# Patient Record
Sex: Female | Born: 1985 | Hispanic: Yes | Marital: Married | Smoking: Never smoker
Health system: Southern US, Community
[De-identification: ages and names within clinical notes are randomized; demographics above are authoritative.]

## PROBLEM LIST (undated history)

## (undated) DIAGNOSIS — R51 Headache: Secondary | ICD-10-CM

## (undated) DIAGNOSIS — R519 Headache, unspecified: Secondary | ICD-10-CM

## (undated) DIAGNOSIS — B690 Cysticercosis of central nervous system: Secondary | ICD-10-CM

## (undated) DIAGNOSIS — O0930 Supervision of pregnancy with insufficient antenatal care, unspecified trimester: Secondary | ICD-10-CM

## (undated) HISTORY — DX: Cysticercosis of central nervous system: B69.0

## (undated) HISTORY — DX: Supervision of pregnancy with insufficient antenatal care, unspecified trimester: O09.30

## (undated) HISTORY — PX: ENDOSCOPIC SURGERY OF THIRD VENTRICLE: SUR445

---

## 2011-08-12 NOTE — L&D Delivery Note (Signed)
Delivery Note At 9:43 AM a viable female was delivered via Vaginal, Spontaneous Delivery (Presentation:ROA).  APGAR: 8, 9; weight .   Placenta status: Intact, Spontaneous.  Cord: 3 vessels with the following complications: .  Cord pH: n/a  Anesthesia: None  Episiotomy: n/a Lacerations: small 1st on r posterior wall, not bleeding no repair; small periurethral tear, not bleeding, no repair Suture Repair: n/a Est. Blood Loss (mL):  Mom to AICU for continued magnesium X24o; BP 150s/90s. Baby to nursery-stable; NICU present at delivery; no resuscitation necessary  Philipp Deputy CNM  Andrena Mews, DO [facility] Resident - PGY-1 12/30/2011 10:00 AM

## 2011-09-01 ENCOUNTER — Other Ambulatory Visit (HOSPITAL_COMMUNITY)
Admission: RE | Admit: 2011-09-01 | Discharge: 2011-09-01 | Disposition: A | Discharge status: Home / Self Care | Payer: Medicaid Other | Source: Ambulatory Visit | Attending: Obstetrics and Gynecology | Admitting: Obstetrics and Gynecology

## 2011-09-01 DIAGNOSIS — Z01419 Encounter for gynecological examination (general) (routine) without abnormal findings: Secondary | ICD-10-CM | POA: Insufficient documentation

## 2011-09-01 DIAGNOSIS — Z113 Encounter for screening for infections with a predominantly sexual mode of transmission: Secondary | ICD-10-CM | POA: Insufficient documentation

## 2011-09-01 LAB — OB RESULTS CONSOLE RPR: RPR: NONREACTIVE

## 2011-09-01 LAB — OB RESULTS CONSOLE ABO/RH: RH Type: POSITIVE

## 2011-11-27 LAB — OB RESULTS CONSOLE GBS: GBS: NEGATIVE

## 2011-12-03 LAB — OB RESULTS CONSOLE GC/CHLAMYDIA: Chlamydia: NEGATIVE

## 2011-12-25 ENCOUNTER — Encounter (HOSPITAL_COMMUNITY): Payer: Self-pay

## 2011-12-25 ENCOUNTER — Inpatient Hospital Stay (HOSPITAL_COMMUNITY)
Admission: AD | Admit: 2011-12-25 | Discharge: 2011-12-25 | Disposition: A | Discharge status: Home / Self Care | Payer: Self-pay | Source: Ambulatory Visit | Attending: Obstetrics and Gynecology | Admitting: Obstetrics and Gynecology

## 2011-12-25 DIAGNOSIS — O209 Hemorrhage in early pregnancy, unspecified: Secondary | ICD-10-CM | POA: Insufficient documentation

## 2011-12-25 DIAGNOSIS — O4700 False labor before 37 completed weeks of gestation, unspecified trimester: Secondary | ICD-10-CM | POA: Insufficient documentation

## 2011-12-25 DIAGNOSIS — Z3493 Encounter for supervision of normal pregnancy, unspecified, third trimester: Secondary | ICD-10-CM

## 2011-12-25 LAB — WET PREP, GENITAL

## 2011-12-25 NOTE — Discharge Instructions (Signed)
Mtodo para contar los movimientos fetales (Fetal Movement Counts) Nombre de la paciente: __________________________________________________ Wendy Delgado probable de parto:____________________ En los embarazos de alto riesgo se recomienda contar las pataditas, pero tambin es una buena idea que lo hagan todas las Wendy Delgado. Comience a contarlas a las 28 semanas de embarazo. Los movimientos fetales aumentan luego de una comida Immunologist o de comer o beber algo dulce (el nivel de azcar en la sangre est ms alto). Tambin es importante beber gran cantidad de lquidos (hidratarse bien) antes de contar. Si se recuesta sobre el lado izquierdo mejorar la Wendy Delgado, o puede sentarse en una silla cmoda con los brazos sobre el abdomen y sin distracciones que la rodeen. CONTANDO  Trate de contar a la Wendy Delgado lo haga.   Marque el da y la hora y vea cunto le lleva sentir 10 movimientos (patadas, agitaciones, sacudones, vueltas). Debe sentir al menos 10 movimientos en 2 horas. Probablemente sienta los 10 movimientos en menos de dos horas. Si no los siente, espere una hora y cuente nuevamente. Luego de Wendy Delgado tendr un patrn.   Debemos observar si hay cambios en el patrn o no hay suficientes pataditas en 2 horas. Le lleva ms tiempo contar los 10 movimientos?  SOLICITE ATENCIN MDICA SI:  Siente menos de 10 pataditas en 2 horas. Wendy Delgado dos veces.   No siente movimientos durante 1 hora.   El patrn se modifica o le lleva ms tiempo Wendy Delgado las 10 pataditas.   Siente que el beb no se mueve como lo hace habitualmente.  Fecha: ____________ Movimientos: ____________ Comienzo hora: ____________ Wendy Delgado: ____________ Wendy Delgado: ____________ Movimientos: ____________ Comienzo hora: ____________ Wendy Delgado: ____________ Wendy Delgado: ____________ Movimientos: ____________ Comienzo hora: ____________ Wendy Delgado: ____________ Wendy Delgado: ____________ Movimientos: ____________ Comienzo hora:  ____________ Wendy Delgado: ____________ Wendy Delgado: ____________ Movimientos: ____________ Comienzo hora: ____________ Wendy Delgado: ____________ Wendy Delgado: ____________ Movimientos: ____________ Comienzo hora: ____________ Wendy Delgado: ____________ Wendy Delgado: ____________ Movimientos: ____________ Comienzo hora: ____________ Wendy Delgado: ____________  Wendy Delgado: ____________ Movimientos: ____________ Comienzo hora: ____________ Wendy Delgado: ____________ Wendy Delgado: ____________ Movimientos: ____________ Comienzo hora: ____________ Wendy Delgado: ____________ Wendy Delgado: ____________ Movimientos: ____________ Comienzo hora: ____________ Wendy Delgado: ____________ Wendy Delgado: ____________ Movimientos: ____________ Comienzo hora: ____________ Wendy Delgado: ____________ Wendy Delgado: ____________ Movimientos: ____________ Comienzo hora: ____________ Wendy Delgado: ____________ Wendy Delgado: ____________ Movimientos: ____________ Comienzo hora: ____________ Wendy Delgado: ____________ Wendy Delgado: ____________ Movimientos: ____________ Comienzo hora: ____________ Wendy Delgado: ____________  Wendy Delgado: ____________ Movimientos: ____________ Comienzo hora: ____________ Wendy Delgado: ____________ Wendy Delgado: ____________ Movimientos: ____________ Comienzo hora: ____________ Wendy Delgado: ____________ Wendy Delgado: ____________ Movimientos: ____________ Comienzo hora: ____________ Wendy Delgado: ____________ Wendy Delgado: ____________ Movimientos: ____________ Comienzo hora: ____________ Wendy Delgado: ____________ Wendy Delgado: ____________ Movimientos: ____________ Comienzo hora: ____________ Wendy Delgado: ____________ Wendy Delgado: ____________ Movimientos: ____________ Comienzo hora: ____________ Wendy Delgado: ____________ Wendy Delgado: ____________ Movimientos: ____________ Comienzo hora: ____________ Wendy Delgado: ____________  Wendy Delgado: ____________ Movimientos: ____________ Comienzo hora: ____________ Wendy Delgado: ____________ Wendy Delgado: ____________ Movimientos: ____________ Comienzo hora: ____________ Wendy Delgado: ____________ Wendy Delgado: ____________ Movimientos: ____________  Comienzo hora: ____________ Wendy Delgado: ____________ Wendy Delgado: ____________ Movimientos: ____________ Comienzo hora: ____________ Wendy Delgado: ____________ Wendy Delgado: ____________ Movimientos: ____________ Comienzo hora: ____________ Wendy Delgado: ____________ Wendy Delgado: ____________ Movimientos: ____________ Comienzo hora: ____________ Wendy Delgado: ____________ Wendy Delgado: ____________ Movimientos: ____________ Comienzo hora: ____________ Wendy Delgado: ____________  Wendy Delgado: ____________ Movimientos: ____________ Comienzo hora: ____________ Wendy Delgado: ____________ Wendy Delgado: ____________ Movimientos: ____________ Comienzo hora: ____________ Wendy Delgado: ____________ Wendy Delgado: ____________ Movimientos: ____________ Comienzo hora: ____________ Wendy Delgado: ____________ Wendy Delgado: ____________ Movimientos: ____________ Comienzo hora: ____________ Wendy Delgado: ____________ Wendy Delgado: ____________ Movimientos:  ____________ Comienzo hora: ____________ Wendy Delgado: ____________ Wendy Delgado: ____________ Movimientos: ____________ Comienzo hora: ____________ Wendy Delgado: ____________ Wendy Delgado: ____________ Movimientos: ____________ Comienzo hora: ____________ Wendy Delgado: ____________  Wendy Delgado: ____________ Movimientos: ____________ Comienzo hora: ____________ Wendy Delgado: ____________ Wendy Delgado: ____________ Movimientos: ____________ Comienzo hora: ____________ Wendy Delgado: ____________ Wendy Delgado: ____________ Movimientos: ____________ Comienzo hora: ____________ Wendy Delgado: ____________ Wendy Delgado: ____________ Movimientos: ____________ Comienzo hora: ____________ Wendy Delgado: ____________ Wendy Delgado: ____________ Movimientos: ____________ Comienzo hora: ____________ Wendy Delgado: ____________ Wendy Delgado: ____________ Movimientos: ____________ Comienzo hora: ____________ Wendy Delgado: ____________ Wendy Delgado: ____________ Movimientos: ____________ Comienzo hora: ____________ Wendy Delgado: ____________  Wendy Delgado: ____________ Movimientos: ____________ Comienzo hora: ____________ Wendy Delgado: ____________ Wendy Delgado: ____________ Movimientos:  ____________ Comienzo hora: ____________ Wendy Delgado: ____________ Wendy Delgado: ____________ Movimientos: ____________ Comienzo hora: ____________ Wendy Delgado: ____________ Wendy Delgado: ____________ Movimientos: ____________ Comienzo hora: ____________ Wendy Delgado: ____________ Wendy Delgado: ____________ Movimientos: ____________ Comienzo hora: ____________ Wendy Delgado: ____________ Wendy Delgado: ____________ Movimientos: ____________ Comienzo hora: ____________ Wendy Delgado: ____________ Wendy Delgado: ____________ Movimientos: ____________ Comienzo hora: ____________ Wendy Delgado: ____________  Wendy Delgado: ____________ Movimientos: ____________ Comienzo hora: ____________ Wendy Delgado: ____________ Wendy Delgado: ____________ Movimientos: ____________ Comienzo hora: ____________ Wendy Delgado: ____________ Wendy Delgado: ____________ Movimientos: ____________ Comienzo hora: ____________ Wendy Delgado: ____________ Wendy Delgado: ____________ Movimientos: ____________ Comienzo hora: ____________ Wendy Delgado: ____________ Wendy Delgado: ____________ Movimientos: ____________ Comienzo hora: ____________ Wendy Delgado: ____________ Wendy Delgado: ____________ Movimientos: ____________ Comienzo hora: ____________ Wendy Delgado: ____________ Wendy Delgado: ____________ Movimientos: ____________ Comienzo hora: ____________ Fin hora: ____________  Document Released: 11/04/2007 Document Revised: 07/17/2011 ExitCare Patient Information 2012 [place], [place].Contracciones de Designer, multimedia ([employer]) Usted presenta un falso trabajo de [place]. Durante todo el embarazo aparecen con frecuencia contracciones del tero. Hacia el final del embarazo (32-34 semanas) estas contracciones (Braxton Hicks) pueden hacerse ms fuertes. No se trata de un trabajo de parto verdadero porque no producen un agrandamiento (dilatacin) y afinamiento del cuello del tero. Algunas veces resulta difcil distinguirlas del trabajo de parto verdadero porque en algunos casos llegan a ser muy intensas y las personas tienen distinta tolerancia al [employer] No  debe sentirse avergonzada si ingresa al hospital con un falso trabajo de [place]. En ocasiones la nica forma de saber si est en un parto verdadero es observar los cambios en el cuello del tero. A veces, la nica forma de saber si realmente est en trabajo de parto es para el mdico observar los cambios en el tero. Como diferenciar el [place] de parto falso del verdadero:  Aggie Cosier de parto falso.   Las contracciones falsas generalmente duran menos y no son tan intensas como las verdaderas.   Generalmente se sienten en la zona inferior del abdomen y en la ingle.   Pueden aliviarse con una caminata o cambiar de posicin mientras se est acostada.   A medida que pasa el tiempo son ms cortas y dbiles.   Generalmente son irregulares.   No se hacen progresivamente ms intensas y Herbalist entre s Lear Delgado.   Trabajo de parto verdadero.   Las contracciones verdaderas duran de 30 a 70 segundos, son ms regulares, generalmente se hacen ms intensas y Comptroller.   No desaparecen al caminar.   La molestia generalmente se siente en la parte superior del tero y se extiende hacia la zona inferior del abdomen y [employer] cintura.   El profesional que la asiste podr examinarla para determinar si el trabajo de parto es verdadero. El examen mostrar si el cuello del tero se est dilatando y [place].  Si no hay problemas prenatales u otras complicaciones de [employer] al  embarazo, no habr inconvenientes si la envan a su casa y espera el comienzo del verdadero trabajo de [place]. INSTRUCCIONES PARA EL CUIDADO DOMICILIARIO  Siga con los ejercicios y las indicaciones habituales.   Tome los medicamentos como se le indic.   Cumpla con las citas regularmente.   Coma y beba ligero si cree que dar a luz.   Si se siente incmoda por las contracciones:   Cambie de [place], si est acostada o en reposo, camine y si est caminando, repose.   Sintense y repose en  una baadera con agua caliente.   Beba entre 2 y 3 vasos de [place]. La deshidratacin puede causar contracciones BH.   Respire lenta y profundamente varias veces por hora.  SOLICITE ATENCIN MDICA DE INMEDIATO SI:  Las contracciones se intensifican, se hacen ms regulares y [employer] s.   Tiene una prdida importante de lquido de la vagina   La temperatura oral se eleva sin motivo por encima de 102 F (38.9 C) o segn le indique el profesional que la asiste.   Elimina una mucosidad sanguinolenta.   Presenta hemorragia vaginal.   Presenta dolor abdominal constante.   Siente un dolor en la parte baja de la espalda que nunca haba sentido antes.   Siente que el beb empuja hacia abajo y le causa presin plvica.   El beb no se mueve tanto como antes.  Document Released: 05/07/2005 Document Revised: 07/17/2011 [facility] Patient Information 2012 [place], [place].

## 2011-12-25 NOTE — Progress Notes (Signed)
[facility] Department of Clinical Social Work Documentation of Interpretation   I assisted __Sherryl RN_________________ with interpretation of ____questions__________________ for this patient.

## 2011-12-25 NOTE — MAU Provider Note (Signed)
  History     CSN: [id number]  Arrival date and time: 12/25/11 1319   None     Chief Complaint  Patient presents with  . Vaginal Bleeding  . Abdominal Pain  . Labor Eval   HPI  Pt is here with report of blood tinged mucus while wiping today.  Also, reports contractions that started today.  Intercourse earlier today.  Increase urge to urinate when having contractions.  Denies UTI symptoms.  +fetal movement.  History reviewed. No pertinent past medical history.  History reviewed. No pertinent past surgical history.  History reviewed. No pertinent family history.  History  Substance Use Topics  . Smoking status: Not on file  . Smokeless tobacco: Not on file  . Alcohol Use:     Allergies: No Known Allergies  Prescriptions prior to admission  Medication Sig Dispense Refill  . Prenatal Vit-Fe Fumarate-FA (PRENATAL MULTIVITAMIN) TABS Take 1 tablet by mouth daily.        Review of Systems  Gastrointestinal: Positive for abdominal pain (contractions).  Genitourinary:       Blood tinged mucus  All other systems reviewed and are negative.   Physical Exam   Blood pressure 135/87, pulse 86, temperature 97.7 F (36.5 C), temperature source Oral, height 5' 0.5" (1.537 m), weight 104.599 kg (230 lb 9.6 oz).  Physical Exam  Constitutional: She is oriented to person, place, and time. She appears well-developed and well-nourished. No distress.       Laughing with FOB  HENT:  Head: Normocephalic.  Neck: Normal range of motion. Neck supple.  Cardiovascular: Normal rate, regular rhythm and normal heart sounds.   Respiratory: Effort normal and breath sounds normal.  GI: Soft. There is no tenderness.  Genitourinary: No bleeding around the vagina. Vaginal discharge (mucusy) found.       1-2/50/-3 Ferns negative  Musculoskeletal: Normal range of motion. She exhibits edema ( +3 pedal swelling).  Neurological: She is alert and oriented to person, place, and time.  Skin: Skin is  warm and dry.    MAU Course  Procedures Results for orders placed during the hospital encounter of 12/25/11 (from the past 24 hour(s))  WET PREP, GENITAL     Status: Abnormal   Collection Time   12/25/11  3:43 PM      Component Value Range   Yeast Wet Prep HPF POC NONE SEEN  NONE SEEN    Trich, Wet Prep NONE SEEN  NONE SEEN    Clue Cells Wet Prep HPF POC NONE SEEN  NONE SEEN    WBC, Wet Prep HPF POC MANY (*) NONE SEEN   pt DC home  Fern-neg per Sid Falcon, CNM  Assessment and Plan  Bleeding During Intercourse Braxton Hicks  Plan: DC to home Labor Precautions   [facility] 12/25/2011, 3:55 PM

## 2011-12-25 NOTE — Progress Notes (Signed)
[facility] Department of Clinical Social Work Documentation of Interpretation   I assisted __Helen RN_________________ with interpretation of ___questions___________________ for this patient.

## 2011-12-25 NOTE — MAU Note (Signed)
Patient passed her mucus plug this morning has had contractions G1 [facility] 5/12

## 2011-12-25 NOTE — Progress Notes (Signed)
Pt given discharge instructions with interpreter at bedside. Pt voiced understanding and discharge instructions printed int spanish.

## 2011-12-26 ENCOUNTER — Telehealth (HOSPITAL_COMMUNITY): Payer: Self-pay | Admitting: *Deleted

## 2011-12-26 ENCOUNTER — Encounter (HOSPITAL_COMMUNITY): Payer: Self-pay | Admitting: *Deleted

## 2011-12-26 NOTE — Telephone Encounter (Signed)
Preadmission screen Interpreter number [phone]

## 2011-12-26 NOTE — MAU Provider Note (Signed)
Agree with above note.  Wendy Delgado 12/26/2011 9:04 AM   

## 2011-12-29 ENCOUNTER — Inpatient Hospital Stay (HOSPITAL_COMMUNITY)
Admission: AD | Admit: 2011-12-29 | Discharge: 2012-01-01 | DRG: 774 | Disposition: A | Discharge status: Home / Self Care | Payer: Medicaid Other | Source: Ambulatory Visit | Attending: Obstetrics & Gynecology | Admitting: Obstetrics & Gynecology

## 2011-12-29 ENCOUNTER — Encounter (HOSPITAL_COMMUNITY): Payer: Self-pay | Admitting: *Deleted

## 2011-12-29 DIAGNOSIS — O1414 Severe pre-eclampsia complicating childbirth: Principal | ICD-10-CM | POA: Diagnosis present

## 2011-12-29 LAB — COMPREHENSIVE METABOLIC PANEL
Albumin: 2.7 g/dL — ABNORMAL LOW (ref 3.5–5.2)
Alkaline Phosphatase: 214 U/L — ABNORMAL HIGH (ref 39–117)
BUN: 6 mg/dL (ref 6–23)
CO2: 19 mEq/L (ref 19–32)
Chloride: 101 mEq/L (ref 96–112)
Creatinine, Ser: 0.33 mg/dL — ABNORMAL LOW (ref 0.50–1.10)
GFR calc Af Amer: >90 mL/min (ref >90)
GFR calc non Af Amer: >90 mL/min (ref >90)
Glucose, Bld: 100 mg/dL — ABNORMAL HIGH (ref 70–99)
Total Bilirubin: 0.3 mg/dL (ref 0.3–1.2)

## 2011-12-29 LAB — CBC
HCT: 40.1 % (ref 36.0–46.0)
MCH: 31.4 pg (ref 26.0–34.0)
MCV: 93.9 fL (ref 78.0–100.0)
Platelets: 273 10*3/uL (ref 150–400)
RBC: 4.27 MIL/uL (ref 3.87–5.11)

## 2011-12-29 MED ORDER — MAGNESIUM SULFATE 40 G IN LACTATED RINGERS - SIMPLE
2.0000 g/h | INTRAVENOUS | Status: DC
  Filled 2011-12-29: qty 500

## 2011-12-29 MED ORDER — ACETAMINOPHEN 325 MG PO TABS: 650.0000 mg | ORAL_TABLET | ORAL | Status: DC | PRN

## 2011-12-29 MED ORDER — ONDANSETRON HCL 4 MG/2ML IJ SOLN: 4.0000 mg | Freq: Four times a day (QID) | INTRAMUSCULAR | Status: DC | PRN

## 2011-12-29 MED ORDER — OXYCODONE-ACETAMINOPHEN 5-325 MG PO TABS: 1.0000 | ORAL_TABLET | ORAL | Status: DC | PRN

## 2011-12-29 MED ORDER — LACTATED RINGERS IV SOLN: 500.0000 mL | INTRAVENOUS | Status: DC | PRN

## 2011-12-29 MED ORDER — FLEET ENEMA 7-19 GM/118ML RE ENEM: 1.0000 | ENEMA | RECTAL | Status: DC | PRN

## 2011-12-29 MED ORDER — LACTATED RINGERS IV SOLN
INTRAVENOUS | Status: DC
  Administered 2011-12-29: 23:00:00 via INTRAVENOUS
  Administered 2011-12-30: 50 mL/h via INTRAVENOUS

## 2011-12-29 MED ORDER — OXYTOCIN BOLUS FROM INFUSION
500.0000 mL | Freq: Once | INTRAVENOUS | Status: DC
  Filled 2011-12-29: qty 1000
  Filled 2011-12-29: qty 500

## 2011-12-29 MED ORDER — IBUPROFEN 600 MG PO TABS: 600.0000 mg | ORAL_TABLET | Freq: Four times a day (QID) | ORAL | Status: DC | PRN

## 2011-12-29 MED ORDER — CITRIC ACID-SODIUM CITRATE 334-500 MG/5ML PO SOLN: 30.0000 mL | ORAL | Status: DC | PRN

## 2011-12-29 MED ORDER — LABETALOL HCL 5 MG/ML IV SOLN
20.0000 mg | Freq: Once | INTRAVENOUS | Status: AC
  Administered 2011-12-30: 20 mg via INTRAVENOUS
  Filled 2011-12-29: qty 4

## 2011-12-29 MED ORDER — OXYTOCIN 20 UNITS IN LACTATED RINGERS INFUSION - SIMPLE
125.0000 mL/h | Freq: Once | INTRAVENOUS | Status: AC
  Administered 2011-12-30: 125 mL/h via INTRAVENOUS

## 2011-12-29 MED ORDER — MAGNESIUM SULFATE BOLUS VIA INFUSION
4.0000 g | Freq: Once | INTRAVENOUS | Status: AC
  Administered 2011-12-29: 4 g via INTRAVENOUS
  Filled 2011-12-29: qty 500

## 2011-12-29 MED ORDER — BUTORPHANOL TARTRATE 2 MG/ML IJ SOLN
1.0000 mg | INTRAMUSCULAR | Status: DC | PRN
  Administered 2011-12-30 (×2): 1 mg via INTRAVENOUS
  Filled 2011-12-29 (×2): qty 1

## 2011-12-29 MED ORDER — LIDOCAINE HCL (PF) 1 % IJ SOLN
30.0000 mL | INTRAMUSCULAR | Status: DC | PRN
  Filled 2011-12-29: qty 30

## 2011-12-29 NOTE — H&P (Signed)
Wendy Delgado is a 26 y.o. G1P0 @ 41'1 by uncertain LMP consistent with a 24wk [place] who presented with a chief complaint of LOF, clear/pink, at 21:20.  Ctx started since arrival at hospital.  Good FM.  No frank bleeding.  No HA, vision changes, SOB, CP, RUQ pain.  Has had hand and feet swelling all of pregnancy, stable.  Husband reports face swelling for 1-2 weeks.  Pt denies any elevated BPs in the office.  Last OV was last week.  Cervix 1-2/50%/-3 on 5/16.  Prenatal issues: -Care at [facility] -Late to care (presented after 20 wks). -Dating by uncertain LMP and 24wk [place], which both gave EDD 12/21/11 according to prenatal records.  -GBS neg, O+, rubella immune, HSV neg, HIV neg, Hgb normal, RPR NR. -No elevated BPs noted in prenatal record (last note is from 12/11/11).  Maternal Medical History:  Reason for admission: Reason for Admission:   nauseaPrenatal complications: Hypertension.     OB History    Grav Para Term Preterm Abortions TAB SAB Ect Mult Living   1 0 0 0 0 0 0 0 0 0      Past Medical History  Diagnosis Date  . Late prenatal care    History reviewed. No pertinent past surgical history. Family History: family history includes Diabetes in an unspecified family member. Social History:  reports that she has never smoked. She has never used smokeless tobacco. She reports that she does not drink alcohol or use illicit drugs.  Review of Systems  Constitutional: Negative for fever and chills.  Eyes: Negative for blurred vision and double vision.  Respiratory: Negative for shortness of breath and wheezing.   Cardiovascular: Negative for chest pain and palpitations.  Gastrointestinal: Negative for nausea, vomiting and diarrhea.  Genitourinary: Negative for dysuria and urgency.  Musculoskeletal: Negative for myalgias and joint pain.  Skin: Negative for itching and rash.  Neurological: Negative for focal weakness, seizures and headaches.  Endo/Heme/Allergies: Negative for  polydipsia. Does not bruise/bleed easily.  Psychiatric/Behavioral: Negative for depression. The patient is not nervous/anxious.    Blood pressure trend: 128/92 173/96 164/94 171/104 171/82 Pulse 76, temperature 98.3 F (36.8 C), temperature source Oral, resp. rate 18, height 5' 0.5" (1.537 m), weight 102.967 kg (227 lb), SpO2 100.00%. Exam Physical Exam  Constitutional: She is oriented to person, place, and time. No distress.  HENT:  Head: Normocephalic and atraumatic.  Eyes: Conjunctivae and EOM are normal.  Neck: Normal range of motion. Neck supple.  Cardiovascular: Normal rate, regular rhythm, normal heart sounds and intact distal pulses.   Respiratory: Effort normal and breath sounds normal.  GI:       Gravid, EFW 7.5lb, no RUQ tenderness.  Genitourinary: Vagina normal.  Musculoskeletal: Normal range of motion.       1+ pitting edema to mid-pre-tibial areas bilaterally.  No pitting edema hands or face.  Neurological: She is alert and oriented to person, place, and time.       2+ DTRs biceps bilaterally.  Skin: Skin is warm and dry. She is not diaphoretic.  Psychiatric: She has a normal mood and affect. Her behavior is normal.   EFM: baseline 145, moderate variability, +accels, intermittent early decels. Toco: q35min SVE: 2.5/90/0/vtx including sutures palpated/mid-position/soft/grossly ruptured, light mec, +fern   Prenatal labs: ABO, Rh: O/Positive/-- (01/21 0000) Antibody: Negative (01/21 0000) Rubella: Immune (01/21 0000) RPR: Nonreactive (01/21 0000)  HBsAg: Negative (01/21 0000)  HIV: Non-reactive (01/21 0000)  GBS: Negative (04/18 0000)   Assessment/Plan: Wendy Delgado  Threat is a 26 y.o. G1P0 @ 41'1 by uncertain LMP consistent with a 24wk [place] who is being admitted with SROM, latent labor, and severe-range BPs with concern for severe preeclampsia.  -Severe-range BPs with concern for severe preeclampsia:  No elevated BPs in office.  Possible face swelling x 2 wks but no  other signs or symptoms of preeclampsia (apparently LE edema is at baseline).  CMP, CBC and urine P:C via straight cath are pending.  Will give labetalol 20mg  IV x 1 dose now for severe-range BP and start Magnesium (4g bolus then 2g per hour) for seizure ppx.  Will augment labor if needed to expedite delivery.      -SROM/labor: SROM, clear, at 21:20, now with light mec.  Latent labor, but increasingly painful and regular ctx.  Will recheck cervix 2hrs from last check and plan pitocin if inadequate change.  Anticipate SVD.  -Pain control: wants to avoid epidural if possible but is interested in IV medications.   -FWB: cat 1 tracing, vtx, GBS neg, light meconium, will have additional neonatal providers at delivery.  Chancy Hurter MD 12/29/2011, 11:21 PM   Saw pt and agree with above documentation. 2 hr glucola: [phone]. No clonus.   Meconium stained amniotic fluid. D/W Dr. Macon Large re: plan to proceed with MgSO4 and IV labetalol.   Koi Zangara 12/30/2011 12:03 AM

## 2011-12-30 ENCOUNTER — Inpatient Hospital Stay (HOSPITAL_COMMUNITY): Admission: RE | Admit: 2011-12-30 | Payer: Self-pay | Source: Ambulatory Visit | Admitting: Obstetrics & Gynecology

## 2011-12-30 ENCOUNTER — Encounter (HOSPITAL_COMMUNITY): Payer: Self-pay | Admitting: *Deleted

## 2011-12-30 DIAGNOSIS — O1414 Severe pre-eclampsia complicating childbirth: Secondary | ICD-10-CM

## 2011-12-30 LAB — COMPREHENSIVE METABOLIC PANEL
Albumin: 2.7 g/dL — ABNORMAL LOW (ref 3.5–5.2)
BUN: 6 mg/dL (ref 6–23)
Calcium: 8.4 mg/dL (ref 8.4–10.5)
Creatinine, Ser: 0.42 mg/dL — ABNORMAL LOW (ref 0.50–1.10)
GFR calc Af Amer: >90 mL/min (ref >90)
Glucose, Bld: 154 mg/dL — ABNORMAL HIGH (ref 70–99)
Total Protein: 6.3 g/dL (ref 6.0–8.3)

## 2011-12-30 LAB — PROTEIN / CREATININE RATIO, URINE
Creatinine, Urine: 66.38 mg/dL
Total Protein, Urine: 17.2 mg/dL

## 2011-12-30 MED ORDER — SIMETHICONE 80 MG PO CHEW: 80.0000 mg | CHEWABLE_TABLET | ORAL | Status: DC | PRN

## 2011-12-30 MED ORDER — MAGNESIUM SULFATE 40 G IN LACTATED RINGERS - SIMPLE
2.0000 g/h | INTRAVENOUS | Status: DC
  Administered 2011-12-30: 2 g/h via INTRAVENOUS
  Filled 2011-12-30 (×2): qty 500

## 2011-12-30 MED ORDER — HYDRALAZINE HCL 20 MG/ML IJ SOLN
INTRAMUSCULAR | Status: AC
  Filled 2011-12-30: qty 1

## 2011-12-30 MED ORDER — DIPHENHYDRAMINE HCL 25 MG PO CAPS: 25.0000 mg | ORAL_CAPSULE | Freq: Four times a day (QID) | ORAL | Status: DC | PRN

## 2011-12-30 MED ORDER — PRENATAL MULTIVITAMIN CH
1.0000 | ORAL_TABLET | Freq: Every day | ORAL | Status: DC
  Administered 2011-12-30 – 2012-01-01 (×3): 1 via ORAL
  Filled 2011-12-30 (×3): qty 1

## 2011-12-30 MED ORDER — LANOLIN HYDROUS EX OINT: TOPICAL_OINTMENT | CUTANEOUS | Status: DC | PRN

## 2011-12-30 MED ORDER — OXYCODONE-ACETAMINOPHEN 5-325 MG PO TABS: 1.0000 | ORAL_TABLET | ORAL | Status: DC | PRN

## 2011-12-30 MED ORDER — ZOLPIDEM TARTRATE 5 MG PO TABS: 5.0000 mg | ORAL_TABLET | Freq: Every evening | ORAL | Status: DC | PRN

## 2011-12-30 MED ORDER — TETANUS-DIPHTH-ACELL PERTUSSIS 5-2.5-18.5 LF-MCG/0.5 IM SUSP
0.5000 mL | Freq: Once | INTRAMUSCULAR | Status: AC
  Administered 2011-12-31: 0.5 mL via INTRAMUSCULAR
  Filled 2011-12-30: qty 0.5

## 2011-12-30 MED ORDER — DIBUCAINE 1 % RE OINT
1.0000 "application " | TOPICAL_OINTMENT | RECTAL | Status: DC | PRN
  Filled 2011-12-30: qty 28

## 2011-12-30 MED ORDER — HYDRALAZINE HCL 20 MG/ML IJ SOLN
10.0000 mg | Freq: Once | INTRAMUSCULAR | Status: AC
  Administered 2011-12-30: 10 mg via INTRAVENOUS

## 2011-12-30 MED ORDER — LACTATED RINGERS IV SOLN
INTRAVENOUS | Status: DC
  Administered 2011-12-31: 01:00:00 via INTRAVENOUS

## 2011-12-30 MED ORDER — PRENATAL MULTIVITAMIN CH: 1.0000 | ORAL_TABLET | Freq: Every day | ORAL | Status: DC

## 2011-12-30 MED ORDER — IBUPROFEN 600 MG PO TABS
600.0000 mg | ORAL_TABLET | Freq: Four times a day (QID) | ORAL | Status: DC
  Administered 2011-12-30 – 2012-01-01 (×8): 600 mg via ORAL
  Filled 2011-12-30 (×8): qty 1

## 2011-12-30 MED ORDER — ONDANSETRON HCL 4 MG PO TABS: 4.0000 mg | ORAL_TABLET | ORAL | Status: DC | PRN

## 2011-12-30 MED ORDER — HYDRALAZINE HCL 20 MG/ML IJ SOLN
10.0000 mg | Freq: Once | INTRAMUSCULAR | Status: AC
  Administered 2011-12-30: 10 mg via INTRAVENOUS
  Filled 2011-12-30: qty 1

## 2011-12-30 MED ORDER — ONDANSETRON HCL 4 MG/2ML IJ SOLN: 4.0000 mg | INTRAMUSCULAR | Status: DC | PRN

## 2011-12-30 MED ORDER — WITCH HAZEL-GLYCERIN EX PADS: 1.0000 "application " | MEDICATED_PAD | CUTANEOUS | Status: DC | PRN

## 2011-12-30 MED ORDER — BENZOCAINE-MENTHOL 20-0.5 % EX AERO
1.0000 "application " | INHALATION_SPRAY | CUTANEOUS | Status: DC | PRN
  Filled 2011-12-30: qty 56

## 2011-12-30 MED ORDER — SENNOSIDES-DOCUSATE SODIUM 8.6-50 MG PO TABS
2.0000 | ORAL_TABLET | Freq: Every day | ORAL | Status: DC
  Administered 2011-12-30 – 2011-12-31 (×2): 2 via ORAL

## 2011-12-30 NOTE — MAU Provider Note (Signed)
Pt sent directly to L&D from MAU for admission.  Please see my H&P for details. Chancy Hurter MD  I agree

## 2011-12-30 NOTE — Progress Notes (Signed)
Spanish interpreter has been in room earlier to discuss POC, meals ordered.  Family members who speak English are with pt at this time.  Reviewed POC, safety measures, baby and Mom care.  Understanding verbalized  and demonstrated.

## 2011-12-30 NOTE — Progress Notes (Signed)
Patient assisted to bathroom, peri care explained and then demonstrated by patient. Ordering meal at this time with assistance of Spanish interpreter. Questions answered.

## 2011-12-30 NOTE — Progress Notes (Signed)
Mag Note  Subjective: No complaints.  No HA, vision changes, SOB, CP, RUQ pain.  Pain well-controlled on PO meds.  Ambulating.  Voiding.  Tolerating regular diet.  Objective: Blood pressure 135/73, pulse 113, temperature 98.1 F (36.7 C), temperature source Oral, resp. rate 20, height 5' (1.524 m), weight 100.472 kg (221 lb 8 oz), SpO2 99.00%, unknown if currently breastfeeding. I/O: 2.3/2.4L today, UOP > 2.5 cc/kg/hr post delivery  Physical Exam:  General: well-appearing, NAD Pulm: nl work of breathing, CTAB CV: RRR, 2+ pulses bilaterally Neuro: 2+ DTRs bilaterally Ext: no calf tenderness.  Stable, non-pitting LE edema.  Assessment/Plan: Wendy Delgado is a 26 y.o. G1 now P1001 s/p uncomplicated SVD at 41'2 with presumed severe preeclampsia, now PPD#0, on mag.   -Presumed severe preeclampsia: based on persistent severe-range BPs in labor that required multiple doses of IV labetalol and hydralazine.  Urine P:C 0.26.  Did not have time to collect 24hr urine to confirm.  Cont Mag for 24hrs PP for seizure ppx.  No signs mag toxicity.  No signs/symptoms of preeclampsia.  Appropriate UOP.  BP max 137 systolic, 73 diastolic since delivery with no meds on board.  Cont to monitor for severe-range BPs.  Chancy Hurter MD   12/30/2011, 11:45 PM

## 2011-12-30 NOTE — Progress Notes (Signed)
Subjective: Pt feeling a lot of pressure, like she needs to have BM.  Continued LOF.    Objective: BP 169/84  Pulse 113  Temp(Src) 98.3 F (36.8 C) (Oral)  Resp 20  Ht 5' 0.5" (1.537 m)  Wt 102.967 kg (227 lb)  BMI 43.60 kg/m2  SpO2 100% I/O last 3 completed shifts: In: 940.4 [P.O.:240; I.V.:700.4] Out: 300 [Urine:300]  FHT:  Baseline 150, moderate variability, positive accels.  Rare, brief variable decels vs. Maternal HR.  Most recently intermittent early decels. UC:   q71min SVE:   Dilation: Lip/rim Effacement (%): 100 Station: +1 Exam by:: Dr Lavonia Drafts  Labs: Repeat CBC, CMP pending  Assessment / Plan: Wendy Delgado is a 26 y.o. G1P0 @ 41'2 by uncertain LMP consistent with a 24wk [place] admitted with SROM, latent labor, and severe-range BPs with concern for severe preeclampsia.   -Severe-range BPs with concern for severe preeclampsia:  Initial labs with borderline elevated AST, but ALT nl, Cr nl, Plt nl and urine P:C via straight cath 0.26.  Cannot make clear dx severe preeclampsia without 24hr urine, but empirically treating for severe preeclampsia with Mag.  s/p labetalol 20mg  IV x 1 and hydralazine 10mg  IV x1.  Continue to monitor for severe-range BPs and treat as needed.  -SROM/labor: SROM, clear, at 21:20 on 5/20, now with light mec.  Arrived in latent labor, and has progressed spontaneously to active labor.  Almost fully dilated.  Will await urge to push.  Anticipate SVD.   -Pain control: tolerating labor without epidural, s/p stadol x 1, several hours ago.   -FWB: cat 1 tracing (brief ?variables, spontaneously resolved), vtx, GBS neg, light meconium, will have additional neonatal providers at delivery.   Chancy Hurter MD  12/30/2011, 7:09 AM Agree.

## 2011-12-30 NOTE — Progress Notes (Signed)
Pt pushing upon entering room, SVE done, spanish interpreter at bedside, pt will inform me if she starts pushing again.

## 2011-12-30 NOTE — MAU Provider Note (Signed)
Attestation of Attending Supervision of Advanced Practitioner: Evaluation and management procedures were performed by the OB Fellow/PA/CNM/NP under my supervision and collaboration. Chart reviewed, and agree with management and plan.  Coltan Spinello, M.D. 12/30/2011 8:01 AM   

## 2011-12-30 NOTE — H&P (Signed)
Attestation of Attending Supervision of Advanced Practitioner: Evaluation and management procedures were performed by the OB Fellow/PA/CNM/NP under my supervision and collaboration. Chart reviewed, and agree with management and plan.  Bedford Winsor, M.D. 12/30/2011 7:59 AM   

## 2011-12-30 NOTE — Progress Notes (Signed)
Subjective: Pt having painful ctx and requests IV pain meds but declines epidural right now.  Continued LOF.  No HA/vision changes/SOB/CP/RUQ pain.  Objective: BP 185/98  Pulse 105  Temp(Src) 98.3 F (36.8 C) (Axillary)  Resp 22  Ht 5' 0.5" (1.537 m)  Wt 102.967 kg (227 lb)  BMI 43.60 kg/m2  SpO2 100%   Total I/O In: 379.2  Out: 50   FHT:  Baseline 140, moderate variability, positive accels.  Rare, brief variable decels plus episode of maternal HR getting picked up. UC:   q2-57min SVE:   Dilation: 6 Effacement (%): 100 Station: +1 Exam by:: Dr Lavonia Drafts  Labs: Lab Results  Component Value Date   WBC 12.7* 12/29/2011   HGB 13.4 12/29/2011   HCT 40.1 12/29/2011   MCV 93.9 12/29/2011   PLT 273 12/29/2011  AST 47, ALT 20, Cr 0.33, K 6.4 (presumed hemolyzed), Urine P:C 0.26  Assessment / Plan: Wendy Delgado is a 26 y.o. G1P0 @ 41'2 by uncertain LMP consistent with a 24wk [place] admitted with SROM, latent labor, and severe-range BPs with concern for severe preeclampsia.   -Severe-range BPs with concern for severe preeclampsia:  Labs returned with borderline elevated AST, ALT nl, Cr nl, Plt nl and urine P:C via straight cath 0.26.  Cannot make clear dx severe preeclampsia without 24hr urine, but empirically treating for severe preeclampsia with Mag.  s/p labetalol 20mg  IV x 1, now with recurrent severe-range BP.  Will try hydralazine 10mg  IV now.   -SROM/labor: SROM, clear, at 21:20 on 5/20, now with light mec.  Arrived in latent labor, and has progressed spontaneously to active labor.  Will recheck cervix in 2hrs and plan pitocin if inadequate change. Anticipate SVD.   -Pain control: wants to avoid epidural if possible but is interested in IV medications, will try stadol now given likely remote from delivery.   -FWB: cat 1 tracing, vtx, GBS neg, light meconium, will have additional neonatal providers at delivery.   Chancy Hurter MD  12/30/2011, 2:21  AM  Agree Wendy Delgado 12/30/2011 5:49 AM

## 2011-12-31 NOTE — Progress Notes (Signed)
UR chart review completed.  

## 2011-12-31 NOTE — Progress Notes (Signed)
Chart reviewed and agree with management and plan.  

## 2011-12-31 NOTE — Progress Notes (Signed)
Post Partum Day #1 Subjective: no complaints and breast/bottlefeeding; denies H/A or visual disburb or any other pain  Objective: Blood pressure 124/64, pulse 79, temperature 97.6 F (36.4 C), temperature source Oral, resp. rate 18, height 5' (1.524 m), weight 99.428 kg (219 lb 3.2 oz), SpO2 98.00%, unknown if currently breastfeeding.  Physical Exam:  General: alert, cooperative and no distress Lochia: appropriate Uterine Fundus: firm DVT Evaluation: No evidence of DVT seen on physical exam. Calf/Ankle edema is present +2; no DTRs no clonus   Basename 12/29/11 2252  HGB 13.4  HCT 40.1    Assessment/Plan: PPD#1 Preeclampsia- BPs stable  Plan for discharge tomorrow; Cont mag til 10am and then tx to [facility] this afternoon Anticipate d/c in AM 5/23   LOS: 2 days   Cam Hai 12/31/2011, 7:41 AM

## 2012-01-01 ENCOUNTER — Encounter (HOSPITAL_COMMUNITY): Payer: Self-pay | Admitting: *Deleted

## 2012-01-01 MED ORDER — IBUPROFEN 600 MG PO TABS: 600.0000 mg | ORAL_TABLET | Freq: Four times a day (QID) | ORAL | Status: AC

## 2012-01-01 NOTE — Progress Notes (Signed)
Post Partum Day 2 Subjective: no complaints, up ad lib, voiding and tolerating PO  Objective: Blood pressure 120/78, pulse 67, temperature 98.2 F (36.8 C), temperature source Oral, resp. rate 18, height 5' (1.524 m), weight 219 lb 3.2 oz (99.428 kg), SpO2 100.00%, unknown if currently breastfeeding.  Physical Exam:  General: alert, cooperative and no distress Lochia: appropriate Uterine Fundus: firm Incision: na DVT Evaluation: No evidence of DVT seen on physical exam.   Basename 12/29/11 2252  HGB 13.4  HCT 40.1    Assessment/Plan: Plan for discharge tomorrow   LOS: 3 days   Leonardo Makris H 01/01/2012, 7:46 AM

## 2012-01-01 NOTE — Discharge Summary (Signed)
Obstetric Discharge Summary Reason for Admission: induction of labor Prenatal Procedures: none Intrapartum Procedures: spontaneous vaginal delivery Postpartum Procedures: none Complications-Operative and Postpartum: none Hemoglobin  Date Value Range Status  12/29/2011 13.4  12.0-15.0 (g/dL) Final     HCT  Date Value Range Status  12/29/2011 40.1  36.0-46.0 (%) Final    Physical Exam:  General: alert, cooperative and no distress Lochia: appropriate Uterine Fundus: firm DVT Evaluation: No evidence of DVT seen on physical exam. Negative Homan's sign. No cords or calf tenderness. No significant calf/ankle edema.  Discharge Diagnoses: Term Pregnancy-delivered  Discharge Information: Date: 01/01/2012 Activity: pelvic rest Diet: routine Medications: PNV and Ibuprofen Condition: stable Instructions: refer to practice specific booklet Discharge to: home Follow-up Information    Follow up with [facility] in 6 weeks.         Newborn Data: Live born female  Birth Weight: 6 lb 15.5 oz (3160 g) APGAR: 8, 9  Home with mother.  Ollen Rao JEHIEL 01/01/2012, 10:23 AM

## 2012-01-01 NOTE — Discharge Instructions (Signed)
Parto vaginal °Cuidados luego °(Vaginal Delivery, Care After) °· Cambie el apósito cada vez que vaya al baño.  °· Límpiese suavemente con un papel tisú durante su estadía en el hospital, y siempre hágalo desde adelante hacia atrás. Puede utilizar un rociador con agua caliente del grifo o el tisú descartable, o ambos.  °· Coloque el apósito y el tisú sucios en el cesto de basura del baño, en la bolsa plástica de color rojo.  °· Durante su permanencia en el hospital, guarde los coágulos. Si elimina un coágulo, por favor no tire la cadena. También, si su flujo vaginal le parece excesivo, notifíquelo al personal de enfermería.  °· La primera vez que se levante de la cama después del parto, espere la ayuda de la enfermera. No se levante sola en ningún momento si se siente débil o mareada.  °· Flexione y extienda los tobillos vigorosamente, de modo que pueda sentir que las pantorrillas se endurecen, media docena de veces por hora, cuando está en la cama y despierta.  °· No se siente sobre un pie suyo ni deje las piernas colgando del borde de la cama ni mantenga una posición que dificulte la circulación de las piernas.  °· Muchas mujeres experimentan dolores de entuerto (contracciones uterinas leves) en los dos o tres días después del parto. Pídale a la enfermera un medicamento contra el dolor si lo necesita. Algunas veces la alimentación a pecho estimula los "entuertos"; si le ocurre esto, pida la medicación ½ - ¾ de hora antes de la próxima vez que alimente al bebé.  °· Para su protección y la de su bebé, le pedimos que no traspase las puertas dobles de la entrada de la unidad de obstetricia. No lleve al bebé en b[place] por el corredor. Cuando saque o entre al bebé de la habitación, por favor colóquelo en el cochecito y empújelo.  °· Las mamás pueden tener a sus bebés en la habitación todo lo que deseen.  °Document Released: 07/28/2005 Document Revised: 07/17/2011 °ExitCare® Patient Information ©2012 [employer]. °

## 2014-06-12 ENCOUNTER — Encounter (HOSPITAL_COMMUNITY): Payer: Self-pay | Admitting: *Deleted

## 2014-08-01 ENCOUNTER — Emergency Department (HOSPITAL_COMMUNITY): Payer: Medicaid Other

## 2014-08-01 ENCOUNTER — Inpatient Hospital Stay (HOSPITAL_COMMUNITY)
Admission: EM | Admit: 2014-08-01 | Discharge: 2014-08-15 | DRG: 025 | Payer: Medicaid Other | Attending: Internal Medicine | Admitting: Internal Medicine

## 2014-08-01 ENCOUNTER — Encounter (HOSPITAL_COMMUNITY): Payer: Self-pay

## 2014-08-01 DIAGNOSIS — J9690 Respiratory failure, unspecified, unspecified whether with hypoxia or hypercapnia: Secondary | ICD-10-CM

## 2014-08-01 DIAGNOSIS — R410 Disorientation, unspecified: Secondary | ICD-10-CM | POA: Diagnosis present

## 2014-08-01 DIAGNOSIS — G40901 Epilepsy, unspecified, not intractable, with status epilepticus: Secondary | ICD-10-CM | POA: Diagnosis present

## 2014-08-01 DIAGNOSIS — G40501 Epileptic seizures related to external causes, not intractable, with status epilepticus: Secondary | ICD-10-CM | POA: Insufficient documentation

## 2014-08-01 DIAGNOSIS — G40801 Other epilepsy, not intractable, with status epilepticus: Secondary | ICD-10-CM | POA: Diagnosis present

## 2014-08-01 DIAGNOSIS — Z9289 Personal history of other medical treatment: Secondary | ICD-10-CM

## 2014-08-01 DIAGNOSIS — G919 Hydrocephalus, unspecified: Secondary | ICD-10-CM | POA: Insufficient documentation

## 2014-08-01 DIAGNOSIS — R001 Bradycardia, unspecified: Secondary | ICD-10-CM | POA: Diagnosis not present

## 2014-08-01 DIAGNOSIS — R4182 Altered mental status, unspecified: Secondary | ICD-10-CM

## 2014-08-01 DIAGNOSIS — J9600 Acute respiratory failure, unspecified whether with hypoxia or hypercapnia: Secondary | ICD-10-CM | POA: Diagnosis present

## 2014-08-01 DIAGNOSIS — Z01818 Encounter for other preprocedural examination: Secondary | ICD-10-CM

## 2014-08-01 DIAGNOSIS — B690 Cysticercosis of central nervous system: Secondary | ICD-10-CM | POA: Diagnosis present

## 2014-08-01 DIAGNOSIS — G9340 Encephalopathy, unspecified: Secondary | ICD-10-CM | POA: Diagnosis present

## 2014-08-01 DIAGNOSIS — G911 Obstructive hydrocephalus: Principal | ICD-10-CM | POA: Diagnosis present

## 2014-08-01 DIAGNOSIS — Z978 Presence of other specified devices: Secondary | ICD-10-CM

## 2014-08-01 DIAGNOSIS — G9341 Metabolic encephalopathy: Secondary | ICD-10-CM | POA: Diagnosis present

## 2014-08-01 DIAGNOSIS — R4020 Unspecified coma: Secondary | ICD-10-CM | POA: Diagnosis present

## 2014-08-01 HISTORY — DX: Headache, unspecified: R51.9

## 2014-08-01 HISTORY — DX: Headache: R51

## 2014-08-01 LAB — URINALYSIS, ROUTINE W REFLEX MICROSCOPIC
Bilirubin Urine: NEGATIVE
GLUCOSE, UA: NEGATIVE mg/dL
HGB URINE DIPSTICK: NEGATIVE
Ketones, ur: 40 mg/dL — AB
Leukocytes, UA: NEGATIVE
Nitrite: NEGATIVE
PROTEIN: NEGATIVE mg/dL
Specific Gravity, Urine: 1.02 (ref 1.005–1.030)
Urobilinogen, UA: 0.2 mg/dL (ref 0.0–1.0)
pH: 7.5 (ref 5.0–8.0)

## 2014-08-01 LAB — COMPREHENSIVE METABOLIC PANEL
ALBUMIN: 4.1 g/dL (ref 3.5–5.2)
ALT: 15 U/L (ref 0–35)
AST: 16 U/L (ref 0–37)
Alkaline Phosphatase: 79 U/L (ref 39–117)
Anion gap: 7 (ref 5–15)
BUN: 10 mg/dL (ref 6–23)
CALCIUM: 9 mg/dL (ref 8.4–10.5)
CO2: 25 mmol/L (ref 19–32)
Chloride: 106 mEq/L (ref 96–112)
Creatinine, Ser: 0.52 mg/dL (ref 0.50–1.10)
GFR calc Af Amer: >90 mL/min (ref >90)
GFR calc non Af Amer: >90 mL/min (ref >90)
Glucose, Bld: 117 mg/dL — ABNORMAL HIGH (ref 70–99)
Potassium: 4.2 mmol/L (ref 3.5–5.1)
SODIUM: 138 mmol/L (ref 135–145)
TOTAL PROTEIN: 7.6 g/dL (ref 6.0–8.3)
Total Bilirubin: 0.5 mg/dL (ref 0.3–1.2)

## 2014-08-01 LAB — CBC WITH DIFFERENTIAL/PLATELET
BASOS ABS: 0 10*3/uL (ref 0.0–0.1)
Basophils Relative: 0 % (ref 0–1)
EOS ABS: 0 10*3/uL (ref 0.0–0.7)
EOS PCT: 0 % (ref 0–5)
HCT: 41.4 % (ref 36.0–46.0)
Hemoglobin: 14 g/dL (ref 12.0–15.0)
Lymphocytes Relative: 12 % (ref 12–46)
Lymphs Abs: 1.7 10*3/uL (ref 0.7–4.0)
MCH: 32.3 pg (ref 26.0–34.0)
MCHC: 33.8 g/dL (ref 30.0–36.0)
MCV: 95.4 fL (ref 78.0–100.0)
Monocytes Absolute: 0.3 10*3/uL (ref 0.1–1.0)
Monocytes Relative: 3 % (ref 3–12)
Neutro Abs: 11.7 10*3/uL — ABNORMAL HIGH (ref 1.7–7.7)
Neutrophils Relative %: 85 % — ABNORMAL HIGH (ref 43–77)
PLATELETS: 285 10*3/uL (ref 150–400)
RBC: 4.34 MIL/uL (ref 3.87–5.11)
RDW: 12.8 % (ref 11.5–15.5)
WBC: 13.8 10*3/uL — AB (ref 4.0–10.5)

## 2014-08-01 LAB — RAPID URINE DRUG SCREEN, HOSP PERFORMED
Amphetamines: NOT DETECTED
BARBITURATES: NOT DETECTED
BENZODIAZEPINES: NOT DETECTED
COCAINE: NOT DETECTED
Opiates: NOT DETECTED
TETRAHYDROCANNABINOL: NOT DETECTED

## 2014-08-01 LAB — LACTIC ACID, PLASMA: Lactic Acid, Venous: 1.5 mmol/L (ref 0.5–2.2)

## 2014-08-01 LAB — AMMONIA: Ammonia: 13 umol/L (ref 11–32)

## 2014-08-01 LAB — ETHANOL
Alcohol, Ethyl (B): <5 mg/dL (ref 0–9)
Alcohol, Ethyl (B): <5 mg/dL (ref 0–9)

## 2014-08-01 MED ORDER — FENTANYL CITRATE 0.05 MG/ML IJ SOLN
INTRAMUSCULAR | Status: AC
  Administered 2014-08-01: 23:00:00
  Filled 2014-08-01: qty 2

## 2014-08-01 MED ORDER — SODIUM CHLORIDE 0.9 % IV SOLN
1000.0000 mL | INTRAVENOUS | Status: DC
  Administered 2014-08-01 – 2014-08-04 (×3): 1000 mL via INTRAVENOUS

## 2014-08-01 MED ORDER — LORAZEPAM 2 MG/ML IJ SOLN
INTRAMUSCULAR | Status: AC
  Administered 2014-08-01: 23:00:00
  Filled 2014-08-01: qty 1

## 2014-08-01 MED ORDER — ROCURONIUM BROMIDE 50 MG/5ML IV SOLN
INTRAVENOUS | Status: AC
Start: 2014-08-01 — End: 2014-08-02
  Filled 2014-08-01: qty 2

## 2014-08-01 MED ORDER — LIDOCAINE HCL (CARDIAC) 20 MG/ML IV SOLN
INTRAVENOUS | Status: AC
  Filled 2014-08-01: qty 5

## 2014-08-01 MED ORDER — SUCCINYLCHOLINE CHLORIDE 20 MG/ML IJ SOLN
INTRAMUSCULAR | Status: AC
  Administered 2014-08-01: 23:00:00
  Filled 2014-08-01: qty 1

## 2014-08-01 MED ORDER — ETOMIDATE 2 MG/ML IV SOLN
INTRAVENOUS | Status: AC
  Administered 2014-08-01: 23:00:00
  Filled 2014-08-01: qty 20

## 2014-08-01 NOTE — Progress Notes (Signed)
EMS came to transport patient, placed on their rescue ventilator which is mainly used for emergency situations. Placed patient on this machine- TV set to 500 RR-15 100% and a release pressure of 40. Patient tolerating well with vital signs WNL. Called charge RT and made aware of this.

## 2014-08-01 NOTE — ED Notes (Addendum)
Dr. Effie ShyWentz at bedside, informed of patients labile HR (50-80's) and rhythm (in and out of Atrial Fib.)  Pupils now 2-3 mm, non responsive. Patient withdraws and cross midline with deep cap bed stimuli. All four Ext, still rigid.

## 2014-08-01 NOTE — ED Notes (Signed)
[facility] [place] [facility]. All questions answered. [facility]

## 2014-08-01 NOTE — ED Notes (Signed)
1035- De. Effie ShyWentz, Respiratory, myself, and Buzzy HanBrenda Norman at bedside.  At 1041 200mg  Amidate and 150mg  Succinylcholine given per Dr. Effie ShyWentz order. Patient intubated with 7.5 ET at 23cm mark, postive color change and breath sounds heard bilaterally.  #14 NG tube left nare inserted.  Radiology at bedside now doing portable CXR for ET and NG tube placement.

## 2014-08-01 NOTE — ED Notes (Signed)
Pt transported to Radiology for CT head, by myself and ED [facility].

## 2014-08-01 NOTE — ED Notes (Signed)
Pt responsive to deep pain stimuli, patient will cross the midline with deep capillary bed pressure. All ext rigid with what appears to be decorticate posturing.

## 2014-08-01 NOTE — ED Notes (Signed)
Pt found at home unresponsive approx 7 pm by her husband, apparently did not get concerned until about 30 minutes ago when they found no change in her.  Per ems, pt with pinpoint pupils, unresponsive to narcan or sternal rubs.  Pt received 2 doses of narcan 1 mg x 2 without response.

## 2014-08-01 NOTE — ED Provider Notes (Signed)
CSN: [id number]     Arrival date & time 08/01/14  2055 History   This chart was scribed for Flint MelterElliott L Skyleen Bentley, MD by Murriel HopperAlec Bankhead, ED Scribe. This patient was seen in room APA01/APA01 and the patient's care was started at 9:02 PM.    Chief Complaint  Patient presents with  . Unresponsive      The history is provided by the EMS personnel.     HPI Comments: Wendy Delgado is a 28 y.o. female who presents to the Emergency Department per EMS who is unresponsive. Pt was found two hours PTA by her husband. Pt was treated by EMS with narcan and sternal rubs with no response. Pt received Narcan 1 mg x2 without response. Her friend also notes that she has a child who is around one year old.  Level V Caveat- unresponsive   Past Medical History  Diagnosis Date  . Late prenatal care   . Headache    History reviewed. No pertinent past surgical history. Family History  Problem Relation Age of Onset  . Diabetes     History  Substance Use Topics  . Smoking status: Never Smoker   . Smokeless tobacco: Never Used  . Alcohol Use: No   OB History    Gravida Para Term Preterm AB TAB SAB Ectopic Multiple Living   1 1 1  0 0 0 0 0 0 1     Review of Systems    Allergies  Review of patient's allergies indicates no known allergies.  Home Medications   Prior to Admission medications   Medication Sig Start Date End Date Taking? Authorizing Provider  Prenatal Vit-Fe Fumarate-FA (PRENATAL MULTIVITAMIN) TABS Take 1 tablet by mouth daily.    Historical Provider, MD   There were no vitals taken for this visit. Physical Exam  Constitutional: She appears well-developed and well-nourished. She appears distressed (groaning).  HENT:  Head: Normocephalic and atraumatic.  Left Ear: External ear normal.  Eyes: Conjunctivae are normal.  Divergent gaze. Pinpoint pupils, minimally reactive  Neck: Normal range of motion and phonation normal. Neck supple.  Cardiovascular: Normal rate and regular  rhythm.   Pulmonary/Chest: Effort normal and breath sounds normal. No respiratory distress. She exhibits no tenderness.  Abdominal: Soft. She exhibits no distension and no mass. There is no guarding.  Musculoskeletal: She exhibits no edema.  No deformities  Neurological: She is alert. GCS eye subscore is 4. GCS verbal subscore is 2. GCS motor subscore is 4.  Clonus feet  Skin: Skin is warm and dry.  Psychiatric:  obtunded  Nursing note and vitals reviewed.   ED Course  Procedures (including critical care time)  Immediate assessment on arrival GCS = 10. Sent for CT head to R/O bleeding. Hydrocephalus present without cause, no comparison. PE unchanged on return from CT.   Case discussed with Neurohospitalist, Dr. Hosie PoissonSumner. He agrees with intubation and [facility]. Admit by PCCM, Neurology consult.  Pt intubated by me and placed on Vent. Post-intubation sedation with Ativan and Fentanyl.   Medications  0.9 %  sodium chloride infusion (1,000 mLs Intravenous Rate/Dose Verify 08/02/14 0035)  lidocaine (cardiac) 100 mg/555ml (XYLOCAINE) 20 MG/ML injection 2% (not administered)  rocuronium (ZEMURON) 50 MG/5ML injection (not administered)  0.9 %  sodium chloride infusion (not administered)  heparin injection 5,000 Units (5,000 Units Subcutaneous Given 08/02/14 0618)  pantoprazole (PROTONIX) injection 40 mg (40 mg Intravenous Given 08/02/14 0204)  fentaNYL (SUBLIMAZE) injection 100 mcg (100 mcg Intravenous Given 08/02/14 1041)  fentaNYL (  SUBLIMAZE) injection 100 mcg (100 mcg Intravenous Given 08/02/14 0956)  0.9 %  sodium chloride infusion (not administered)  acetaminophen (TYLENOL) tablet 650 mg (not administered)  chlorhexidine (PERIDEX) 0.12 % solution 15 mL (not administered)  antiseptic oral rinse (CPC / CETYLPYRIDINIUM CHLORIDE 0.05%) solution 7 mL (7 mLs Mouth Rinse Given 08/02/14 0457)  succinylcholine (ANECTINE) 20 MG/ML injection (  Given 08/01/14 2253)  etomidate (AMIDATE) 2  MG/ML injection (  Given 08/01/14 2254)  LORazepam (ATIVAN) 2 MG/ML injection (  Given 08/01/14 2254)  fentaNYL (SUBLIMAZE) 0.05 MG/ML injection (  Given 08/01/14 2254)  gadobenate dimeglumine (MULTIHANCE) injection 20 mL (20 mLs Intravenous Contrast Given 08/02/14 0404)  midazolam (VERSED) 2 MG/2ML injection (  Duplicate 08/02/14 0530)  midazolam (VERSED) injection 2 mg (2 mg Intravenous Given 08/02/14 0520)  fentaNYL (SUBLIMAZE) injection 100 mcg (100 mcg Intravenous Given 08/02/14 0518)    Patient Vitals for the past 24 hrs:  BP Temp Temp src Pulse Resp SpO2 Height Weight  08/02/14 1120 - - - (!) 59 15 100 % - -  08/02/14 1100 (!) 117/55 mmHg - - 64 (!) 24 100 % - -  08/02/14 1000 130/65 mmHg - - 80 12 100 % - -  08/02/14 0900 130/70 mmHg - - 68 11 100 % 5\' 1"  (1.549 m) 219 lb (99.338 kg)  08/02/14 0800 140/80 mmHg 99.4 F (37.4 C) Axillary 72 13 100 % - -  08/02/14 0733 - - - 61 18 100 % - -  08/02/14 0700 (!) 135/58 mmHg - - 63 16 100 % - -  08/02/14 0630 (!) 141/66 mmHg - - 66 16 100 % - -  08/02/14 0600 131/65 mmHg - - 62 16 100 % - -  08/02/14 0555 129/65 mmHg - - 62 16 100 % - -  08/02/14 0550 (!) 133/58 mmHg - - 63 16 100 % - -  08/02/14 0545 (!) 138/57 mmHg - - 64 14 100 % - -  08/02/14 0540 (!) 129/55 mmHg - - 65 16 100 % - -  08/02/14 0535 118/63 mmHg - - 63 16 100 % - -  08/02/14 0530 (!) 134/55 mmHg - - (!) 58 16 100 % - -  08/02/14 0525 (!) 125/57 mmHg - - 60 16 100 % - -  08/02/14 0520 (!) 130/56 mmHg - - 68 16 100 % - -  08/02/14 0500 136/76 mmHg - - 69 17 100 % - -  08/02/14 0430 139/64 mmHg - - 61 16 100 % - -  08/02/14 0230 130/84 mmHg - - (!) 58 - 100 % - -  08/02/14 0200 (!) 95/41 mmHg - - (!) 58 18 100 % - -  08/02/14 0100 (!) 94/33 mmHg - - 63 (!) 30 100 % - -  08/02/14 0045 - 98.1 F (36.7 C) Axillary - - - - -  08/02/14 0033 - - - - - - 5\' 1"  (1.549 m) -  08/01/14 2300 123/59 mmHg - - 82 18 100 % - -  08/01/14 2230 135/70 mmHg - - - 19 - - -  08/01/14  2218 - 99.3 F (37.4 C) Rectal - - - - -  08/01/14 2200 126/62 mmHg - - (!) 55 17 100 % - -  08/01/14 2130 120/78 mmHg - - (!) 59 16 100 % - -  08/01/14 2104 129/66 mmHg - - 81 - 95 % - -  08/01/14 2100 129/66 mmHg - - 60 20 100 % - -  Labs Review Labs Reviewed  CBC WITH DIFFERENTIAL - Abnormal; Notable for the following:    WBC 13.8 (*)    Neutrophils Relative % 85 (*)    Neutro Abs 11.7 (*)    All other components within normal limits  COMPREHENSIVE METABOLIC PANEL - Abnormal; Notable for the following:    Glucose, Bld 117 (*)    All other components within normal limits  URINALYSIS, ROUTINE W REFLEX MICROSCOPIC - Abnormal; Notable for the following:    Ketones, ur 40 (*)    All other components within normal limits  CBC - Abnormal; Notable for the following:    WBC 16.7 (*)    All other components within normal limits  BASIC METABOLIC PANEL - Abnormal; Notable for the following:    Glucose, Bld 138 (*)    Calcium 8.1 (*)    All other components within normal limits  BLOOD GAS, ARTERIAL - Abnormal; Notable for the following:    pCO2 arterial 33.8 (*)    pO2, Arterial 141.0 (*)    Bicarbonate 19.8 (*)    Acid-base deficit 4.3 (*)    All other components within normal limits  PROTEIN AND GLUCOSE, CSF - Abnormal; Notable for the following:    Total  Protein, CSF 14 (*)    All other components within normal limits  CSF CELL COUNT WITH DIFFERENTIAL - Abnormal; Notable for the following:    Appearance, CSF SLIGHT (*)    RBC Count, CSF 400 (*)    WBC, CSF 6 (*)    Segmented Neutrophils-CSF 69 (*)    Lymphs, CSF 25 (*)    Monocyte-Macrophage-Spinal Fluid 5 (*)    All other components within normal limits  MRSA PCR SCREENING  URINE CULTURE  AMMONIA  URINE RAPID DRUG SCREEN (HOSP PERFORMED)  ETHANOL  LACTIC ACID, PLASMA  ETHANOL  MAGNESIUM  PHOSPHORUS  CYSTICERCOSIS AB, IGG BY ELISA    Imaging Review No results found.   EKG Interpretation   Date/Time:   Tuesday August 01 2014 21:01:55 EST Ventricular Rate:  67 PR Interval:  134 QRS Duration: 82 QT Interval:  364 QTC Calculation: 384 R Axis:   53 Text Interpretation:  Sinus arrhythmia Borderline Q waves in inferior  leads Minimal ST depression, inferior leads No old tracing to compare  Confirmed by [facility]  MD, Takara Sermons [phone]) on 08/01/2014 9:36:53 PM       10:10 PM Re-evaluation: Pt was reassessed. Clinical status remains the same.    INTUBATION Performed by: Flint Melter  Required items: required blood products, implants, devices, and special equipment available Patient identity confirmed: provided demographic data and hospital-assigned identification number Time out: Immediately prior to procedure a "time out" was called to verify the correct patient, procedure, equipment, support staff and site/side marked as required.  Indications: AMS, GCS=10, protect airway  Intubation method: Glidescope Laryngoscopy   Preoxygenation: 100% BVM  Sedatives: 20 mg Etomidate Paralytic: 150 mg Succinylcholine  Tube Size: 7.5 cuffed  Post-procedure assessment: chest rise and ETCO2 monitor Breath sounds: equal and absent over the epigastrium Tube secured with: ETT holder Chest x-ray interpreted by radiologist and me.    Patient tolerated the procedure well with no immediate complications.  Radiology reports ET in right mainstem bronchus. Report received after pt left ED. I contacted EMS who agreed to withdraw the ET tube 2 cm.   Medications  0.9 %  sodium chloride infusion (1,000 mLs Intravenous New Bag/Given 08/01/14 2141)  lidocaine (cardiac) 100 mg/57ml (XYLOCAINE) 20 MG/ML injection  2% (not administered)  succinylcholine (ANECTINE) 20 MG/ML injection (not administered)  rocuronium (ZEMURON) 50 MG/5ML injection (not administered)  etomidate (AMIDATE) 2 MG/ML injection (not administered)  LORazepam (ATIVAN) 2 MG/ML injection (not administered)  fentaNYL (SUBLIMAZE) 0.05 MG/ML  injection (not administered)    Patient Vitals for the past 24 hrs:  BP Temp Temp src Pulse Resp SpO2  08/01/14 2218 - 99.3 F (37.4 C) Rectal - - -  08/01/14 2200 126/62 mmHg - - (!) 55 17 100 %  08/01/14 2130 120/78 mmHg - - (!) 59 16 100 %  08/01/14 2104 129/66 mmHg - - 81 - 95 %  08/01/14 2100 129/66 mmHg - - 60 20 100 %    At Transfer Reevaluation with update and discussion. After initial assessment and treatment, an updated evaluation reveals resting comfortably on Vent. Family undated on progress, all questions answered.Mancel Bale L   CRITICAL CARE Performed by: Flint Melter Total critical care time: 65 minutes Critical care time was exclusive of separately billable procedures and treating other patients. Critical care was necessary to treat or prevent imminent or life-threatening deterioration. Critical care was time spent personally by me on the following activities: development of treatment plan with patient and/or surrogate as well as nursing, discussions with consultants, evaluation of patient's response to treatment, examination of patient, obtaining history from patient or surrogate, ordering and performing treatments and interventions, ordering and review of laboratory studies, ordering and review of radiographic studies, pulse oximetry and re-evaluation of patient's condition.  MDM   Final diagnoses:  Altered mental state  Endotracheally intubated  Hydrocephalus    Acute AMS with some posturing. Query status epilepticus, delirium, SBI, occult trauma. Pt is critically ill and requires transfer to higher level of care.   Nursing Notes Reviewed/ Care Coordinated Applicable Imaging Reviewed Interpretation of Laboratory Data incorporated into ED treatment  I personally performed the services described in this documentation, which was scribed in my presence. The recorded information has been reviewed and is accurate.     Flint Melter, MD 08/03/14 [phone]

## 2014-08-02 ENCOUNTER — Inpatient Hospital Stay (HOSPITAL_COMMUNITY): Payer: Medicaid Other

## 2014-08-02 ENCOUNTER — Other Ambulatory Visit (HOSPITAL_COMMUNITY): Payer: Self-pay

## 2014-08-02 DIAGNOSIS — G40901 Epilepsy, unspecified, not intractable, with status epilepticus: Secondary | ICD-10-CM | POA: Diagnosis present

## 2014-08-02 DIAGNOSIS — G919 Hydrocephalus, unspecified: Secondary | ICD-10-CM

## 2014-08-02 DIAGNOSIS — J9621 Acute and chronic respiratory failure with hypoxia: Secondary | ICD-10-CM

## 2014-08-02 DIAGNOSIS — G40301 Generalized idiopathic epilepsy and epileptic syndromes, not intractable, with status epilepticus: Secondary | ICD-10-CM

## 2014-08-02 DIAGNOSIS — G9340 Encephalopathy, unspecified: Secondary | ICD-10-CM | POA: Diagnosis present

## 2014-08-02 LAB — CSF CELL COUNT WITH DIFFERENTIAL
EOS CSF: 1 % (ref 0–1)
Lymphs, CSF: 25 % — ABNORMAL LOW (ref 40–80)
MONOCYTE-MACROPHAGE-SPINAL FLUID: 5 % — AB (ref 15–45)
RBC Count, CSF: 400 /mm3 — ABNORMAL HIGH
Segmented Neutrophils-CSF: 69 % — ABNORMAL HIGH (ref 0–6)
WBC CSF: 6 /mm3 — AB (ref 0–5)

## 2014-08-02 LAB — PROTEIN AND GLUCOSE, CSF
Glucose, CSF: 72 mg/dL (ref 43–76)
TOTAL PROTEIN, CSF: 14 mg/dL — AB (ref 15–45)

## 2014-08-02 LAB — BASIC METABOLIC PANEL
ANION GAP: 10 (ref 5–15)
BUN: 6 mg/dL (ref 6–23)
CHLORIDE: 106 meq/L (ref 96–112)
CO2: 20 mmol/L (ref 19–32)
Calcium: 8.1 mg/dL — ABNORMAL LOW (ref 8.4–10.5)
Creatinine, Ser: 0.61 mg/dL (ref 0.50–1.10)
GFR calc Af Amer: >90 mL/min (ref >90)
GFR calc non Af Amer: >90 mL/min (ref >90)
Glucose, Bld: 138 mg/dL — ABNORMAL HIGH (ref 70–99)
POTASSIUM: 3.5 mmol/L (ref 3.5–5.1)
Sodium: 136 mmol/L (ref 135–145)

## 2014-08-02 LAB — CBC
HEMATOCRIT: 39 % (ref 36.0–46.0)
HEMOGLOBIN: 12.7 g/dL (ref 12.0–15.0)
MCH: 30.9 pg (ref 26.0–34.0)
MCHC: 32.6 g/dL (ref 30.0–36.0)
MCV: 94.9 fL (ref 78.0–100.0)
Platelets: 277 10*3/uL (ref 150–400)
RBC: 4.11 MIL/uL (ref 3.87–5.11)
RDW: 13.1 % (ref 11.5–15.5)
WBC: 16.7 10*3/uL — AB (ref 4.0–10.5)

## 2014-08-02 LAB — BLOOD GAS, ARTERIAL
ACID-BASE DEFICIT: 4.3 mmol/L — AB (ref 0.0–2.0)
BICARBONATE: 19.8 meq/L — AB (ref 20.0–24.0)
Drawn by: 252031
FIO2: 0.4 %
MECHVT: 500 mL
O2 Saturation: 98.8 %
PEEP: 5 cmH2O
PH ART: 7.386 (ref 7.350–7.450)
PO2 ART: 141 mmHg — AB (ref 80.0–100.0)
Patient temperature: 98.6
RATE: 16 resp/min
TCO2: 20.9 mmol/L (ref 0–100)
pCO2 arterial: 33.8 mmHg — ABNORMAL LOW (ref 35.0–45.0)

## 2014-08-02 LAB — MRSA PCR SCREENING: MRSA by PCR: NEGATIVE

## 2014-08-02 LAB — MAGNESIUM: Magnesium: 1.9 mg/dL (ref 1.5–2.5)

## 2014-08-02 LAB — PHOSPHORUS: PHOSPHORUS: 3 mg/dL (ref 2.3–4.6)

## 2014-08-02 MED ORDER — LORAZEPAM 2 MG/ML IJ SOLN
INTRAMUSCULAR | Status: AC
  Filled 2014-08-02: qty 1

## 2014-08-02 MED ORDER — MIDAZOLAM HCL 2 MG/2ML IJ SOLN
INTRAMUSCULAR | Status: AC
  Filled 2014-08-02: qty 2

## 2014-08-02 MED ORDER — ACETAMINOPHEN 325 MG PO TABS: 650.0000 mg | ORAL_TABLET | ORAL | Status: DC | PRN

## 2014-08-02 MED ORDER — MIDAZOLAM HCL 2 MG/2ML IJ SOLN
1.0000 mg | INTRAMUSCULAR | Status: DC | PRN
  Administered 2014-08-02 – 2014-08-03 (×2): 2 mg via INTRAVENOUS
  Filled 2014-08-02 (×2): qty 2

## 2014-08-02 MED ORDER — FENTANYL CITRATE 0.05 MG/ML IJ SOLN
100.0000 ug | INTRAMUSCULAR | Status: DC | PRN
  Administered 2014-08-02 – 2014-08-03 (×4): 100 ug via INTRAVENOUS
  Filled 2014-08-02 (×5): qty 2

## 2014-08-02 MED ORDER — SODIUM CHLORIDE 0.9 % IV SOLN: INTRAVENOUS | Status: DC

## 2014-08-02 MED ORDER — SODIUM CHLORIDE 0.9 % IV SOLN
25.0000 ug/h | INTRAVENOUS | Status: DC
  Administered 2014-08-02: 50 ug/h via INTRAVENOUS
  Administered 2014-08-03: 75 ug/h via INTRAVENOUS
  Filled 2014-08-02 (×2): qty 50

## 2014-08-02 MED ORDER — CETYLPYRIDINIUM CHLORIDE 0.05 % MT LIQD
7.0000 mL | Freq: Four times a day (QID) | OROMUCOSAL | Status: DC
  Administered 2014-08-02 – 2014-08-03 (×7): 7 mL via OROMUCOSAL

## 2014-08-02 MED ORDER — HEPARIN SODIUM (PORCINE) 5000 UNIT/ML IJ SOLN
5000.0000 [IU] | Freq: Three times a day (TID) | INTRAMUSCULAR | Status: DC
  Administered 2014-08-02 – 2014-08-04 (×7): 5000 [IU] via SUBCUTANEOUS
  Filled 2014-08-02 (×10): qty 1

## 2014-08-02 MED ORDER — CHLORHEXIDINE GLUCONATE 0.12 % MT SOLN
15.0000 mL | Freq: Two times a day (BID) | OROMUCOSAL | Status: DC
  Administered 2014-08-02 – 2014-08-03 (×4): 15 mL via OROMUCOSAL
  Filled 2014-08-02 (×2): qty 15

## 2014-08-02 MED ORDER — MIDAZOLAM HCL 2 MG/2ML IJ SOLN
2.0000 mg | Freq: Once | INTRAMUSCULAR | Status: AC
  Administered 2014-08-02: 2 mg via INTRAVENOUS

## 2014-08-02 MED ORDER — PANTOPRAZOLE SODIUM 40 MG PO PACK: 40.0000 mg | PACK | Freq: Every day | ORAL | Status: DC

## 2014-08-02 MED ORDER — PANTOPRAZOLE SODIUM 40 MG IV SOLR
40.0000 mg | Freq: Every day | INTRAVENOUS | Status: DC
  Administered 2014-08-02 – 2014-08-03 (×3): 40 mg via INTRAVENOUS
  Filled 2014-08-02 (×4): qty 40

## 2014-08-02 MED ORDER — FENTANYL CITRATE 0.05 MG/ML IJ SOLN
100.0000 ug | INTRAMUSCULAR | Status: DC | PRN
  Administered 2014-08-02: 100 ug via INTRAVENOUS
  Filled 2014-08-02: qty 2

## 2014-08-02 MED ORDER — GADOBENATE DIMEGLUMINE 529 MG/ML IV SOLN
20.0000 mL | Freq: Once | INTRAVENOUS | Status: AC | PRN
  Administered 2014-08-02: 20 mL via INTRAVENOUS

## 2014-08-02 MED ORDER — SODIUM CHLORIDE 0.9 % IV SOLN: 250.0000 mL | INTRAVENOUS | Status: DC | PRN

## 2014-08-02 MED ORDER — FENTANYL CITRATE 0.05 MG/ML IJ SOLN
100.0000 ug | Freq: Once | INTRAMUSCULAR | Status: AC
Start: 2014-08-02 — End: 2014-08-02
  Administered 2014-08-02: 100 ug via INTRAVENOUS

## 2014-08-02 NOTE — Progress Notes (Signed)
NGT advanced 10cm per Dr Vassie LollAlva. Notified RT of Dr Reginia NaasAlva's order to pull back ETT to 21@lip .  Patient vommited x1 at the end of the MRI. Will continue to monitor.  Macarena Langseth, [place] Marie, RN

## 2014-08-02 NOTE — Consult Note (Addendum)
Consult Reason for Consult: altered mental status Referring Physician: Dr Sherene Sires  CC: altered mental status  HPI: [name] is an 28 y.o. female brought to [facility] ED after being found down unresponsive. She was treated with narcan and sternal rubs by EMS with no response. She received narcan 1mg  x 2 doses without response. Question of possible seizure activity while in [facility] ED. Described as myoclonic jerks of bilateral LE. Transferred from Wendy Delgado to [facility] for further workup and evaluation.   Per description with husband she complained of a severe headache around 2pm and then he did not hear from her until he found her later. He found her down on the floor, unresponsive. Reports she is healthy overall. Has complained of mild headaches in the past but otherwise no significant past medical history.   CT head imaging reviewed and shows signs of hydrocephalus. Discussed case with radiology.    Past Medical History  Diagnosis Date  . Late prenatal care   . Headache     History reviewed. No pertinent past surgical history.  Family History  Problem Relation Age of Onset  . Diabetes      Social History:  reports that she has never smoked. She has never used smokeless tobacco. She reports that she does not drink alcohol or use illicit drugs.  No Known Allergies  Medications:  Scheduled: . heparin  5,000 Units Subcutaneous 3 times per day  . lidocaine (cardiac) 100 mg/11ml      . pantoprazole (PROTONIX) IV  40 mg Intravenous QHS  . rocuronium        ROS: Out of a complete 14 system review, the patient complains of only the following symptoms, and all other reviewed systems are negative. Unable to obtain due to mental status  Physical Examination: Filed Vitals:   08/01/14 2300  BP: 123/59  Pulse: 82  Temp:   Resp: 18   Physical Exam  Constitutional: He appears well-developed and well-nourished.  Psych: Affect appropriate to situation Eyes: No scleral  injection HENT: No OP obstrucion Head: Normocephalic.  Cardiovascular: Normal rate and regular rhythm.  Respiratory: Effort normal and breath sounds normal.  GI: Soft. Bowel sounds are normal. No distension. There is no tenderness.  Skin: WDI  Neurologic Examination Mental Status: Intubated, on no sedation. Eyes closed. No response to verbal stimuli. Mild extension and inversion of bilateral LE to noxious stimuli. Mild flexor posture of bilateral UE to noxious stimuli.  Cranial Nerves: II: unable to visualize fundi due to pupil size. Pupils 2mm and sluggishly reactive bilaterally III,IV, VI: eyes midline, no gaze deviation V,VII:face appears symmetric but intubated so difficult to fully evaluate IX,X: gag reflex present Motor: No spontaneous movement. Extension and inversion of bilateral LE to noxious stimuli. Sensory: see above Deep Tendon Reflexes: 2+ and symmetric throughout Plantars: Right: downgoing   Left: downgoing Cerebellar: Unable to obtain Gait: unable to test  Laboratory Studies:   Basic Metabolic Panel:  Recent Labs Lab 08/01/14 2108  NA 138  K 4.2  CL 106  CO2 25  GLUCOSE 117*  BUN 10  CREATININE 0.52  CALCIUM 9.0    Liver Function Tests:  Recent Labs Lab 08/01/14 2108  AST 16  ALT 15  ALKPHOS 79  BILITOT 0.5  PROT 7.6  ALBUMIN 4.1   No results for input(s): LIPASE, AMYLASE in the last 168 hours.  Recent Labs Lab 08/01/14 2123  AMMONIA 13    CBC:  Recent Labs Lab 08/01/14 2108  WBC 13.8*  NEUTROABS 11.7*  HGB 14.0  HCT 41.4  MCV 95.4  PLT 285    Cardiac Enzymes: No results for input(s): CKTOTAL, CKMB, CKMBINDEX, TROPONINI in the last 168 hours.  BNP: Invalid input(s): POCBNP  CBG: No results for input(s): GLUCAP in the last 168 hours.  Microbiology: Results for orders placed or performed during the hospital encounter of 12/29/11  MRSA PCR Screening     Status: None   Collection Time: 12/30/11 11:35 AM  Result  Value Ref Range Status   MRSA by PCR NEGATIVE NEGATIVE Final    Comment:        The GeneXpert MRSA Assay (FDA approved for NASAL specimens only), is one component of a comprehensive MRSA colonization surveillance program. It is not intended to diagnose MRSA infection nor to guide or monitor treatment for MRSA infections.    Coagulation Studies: No results for input(s): LABPROT, INR in the last 72 hours.  Urinalysis:  Recent Labs Lab 08/01/14 2140  COLORURINE YELLOW  LABSPEC 1.020  PHURINE 7.5  GLUCOSEU NEGATIVE  HGBUR NEGATIVE  BILIRUBINUR NEGATIVE  KETONESUR 40*  PROTEINUR NEGATIVE  UROBILINOGEN 0.2  NITRITE NEGATIVE  LEUKOCYTESUR NEGATIVE    Lipid Panel:  No results found for: CHOL, TRIG, HDL, CHOLHDL, VLDL, LDLCALC  HgbA1C: No results found for: HGBA1C  Urine Drug Screen:     Component Value Date/Time   LABOPIA NONE DETECTED 08/01/2014 2140   COCAINSCRNUR NONE DETECTED 08/01/2014 2140   LABBENZ NONE DETECTED 08/01/2014 2140   AMPHETMU NONE DETECTED 08/01/2014 2140   THCU NONE DETECTED 08/01/2014 2140   LABBARB NONE DETECTED 08/01/2014 2140    Alcohol Level:  Recent Labs Lab 08/01/14 2108 08/01/14 2123  ETH <5 <5     Imaging: Ct Head Wo Contrast  08/01/2014   CLINICAL DATA:  Altered mental status  EXAM: CT HEAD WITHOUT CONTRAST  TECHNIQUE: Contiguous axial images were obtained from the base of the skull through the vertex without intravenous contrast.  COMPARISON:  None.  FINDINGS: No intracranial hemorrhage or midline shift. No skull fracture is noted. Paranasal sinuses and mastoid air cells are unremarkable.  There is moderate hydrocephalus. The gray and white-matter differentiation is preserved. No definite acute cortical infarction. No intra or extra-axial fluid collection.  IMPRESSION: No intracranial hemorrhage or midline shift. Moderate hydrocephalus. The gray and white-matter differentiation is preserved. No definite acute cortical infarction.    Electronically Signed   By: Natasha MeadLiviu  Pop M.D.   On: 08/01/2014 21:32   Dg Chest Portable 1 View  08/01/2014   CLINICAL DATA:  Intubation, unresponsive  EXAM: PORTABLE CHEST - 1 VIEW  COMPARISON:  None.  FINDINGS: Endotracheal tube tip terminates 7 mm over the right mainstem bronchus. Nasogastric tube tip terminates below the level of the hemidiaphragms but is not included in the field of view. Lungs are hypoaerated with crowding of the bronchovascular markings. Bilateral linear atelectasis is present. Patchy retrocardiac opacity is suboptimally visualized. No pleural effusion. Heart size is normal.  IMPRESSION: Patchy bilateral atelectasis and retrocardiac opacity which could also represent atelectasis although early pneumonia could appear similar  Right mainstem bronchus intubation. Recommend pulling back endotracheal tube approximately 2.7 cm.  Critical Value/emergent results were called by telephone at the time of interpretation on 08/01/2014 at 11:43 pm to Dr. Mancel BaleELLIOTT WENTZ , who verbally acknowledged these results.   Electronically Signed   By: Christiana PellantGretchen  Green M.D.   On: 08/01/2014 23:44     Assessment/Plan:  28y/o woman with no pertinent  past medical history found down unresponsive with unclear etiology. Currently off sedation with no notable movements or response. CT head shows moderate hydrocephalus which per discussion with radiology appears acute. Will order stat MRI with and without contrast and MRV. Depending on results will consider neurosurgery consult.   Addendum: MRI completed. Reviewed images with neuroradiologist on call. 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 Unclear etiology. Page sent out to Dr Danielle DessElsner, neurosurgery, requesting consult. Will plan for emergent drain.    Elspeth Choeter Sammantha Mehlhaff, DO [facility] [phone]  If 7pm- 7am, please page neurology on call as listed in AMION. 08/02/2014, 12:50 AM     This patient is critically ill and at significant risk of neurological worsening, death and care requires constant monitoring of vital signs, hemodynamics,respiratory and cardiac monitoring,review of multiple databases, neurological assessment, discussion with family, other specialists and medical decision making of high complexity. I spent 55 inutes of neurocritical care time in the care of this patient.

## 2014-08-02 NOTE — Progress Notes (Signed)
UR completed.  Lynzi Meulemans, RN BSN MHA CCM Trauma/Neuro ICU Case Manager [phone]  

## 2014-08-02 NOTE — Procedures (Addendum)
Wendy Delgado is a 28 year old [place] individual who felt ill this past day. She was brought to [facility] where she was severely obtunded. CT scan showed hydrocephalus. She had respiratory compromise. She was intubated. [facility] was [facility]. 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 An emergency ventriculostomy is now being placed in the right frontal region.  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 Procedure: The right frontal scalp was shaved prepped with alcohol and DuraPrep and draped in a sterile fashion. An incision was made 2 cm lateral to the midline for a distance of 2 cm. The galea was identified. The periosteum was identified. A twist drill burr hole was created in this region just at the coronal suture. The outer layer the dura was identified. It was perforated with an 11 blade. A ventricular catheter was then inserted to a depth of 6 cm. Spinal fluid under significant pressure that exited out the top of the catheter was relieved. The pressure was estimated at greater than 30 cm of water. The fluid initially bloody rapidly cleared. A sample of CSF was sent for cultures and study. The patient tolerated procedure. The catheter was then tunneled for a distance of 5 cm and connected to a CSF collection system. This was all done sterilely. Incision was then closed with 3-0 nylon and the catheter was secured with 3-0 nylon. A dry sterile dressing was applied.

## 2014-08-02 NOTE — Progress Notes (Addendum)
Patient ID: Wendy HireFlorencia Kortz, female   DOB: 08-30-1985, 28 y.o.   MRN: [id number] Remains intubated this evening however responds well and follows commands to nurses. She is been moving all 4 extremities. Continue ventricular drainage.

## 2014-08-02 NOTE — Progress Notes (Signed)
INITIAL NUTRITION ASSESSMENT  DOCUMENTATION CODES Per approved criteria  -Morbid Obesity   INTERVENTION: Initiate Vital High Protein @ 20 ml/hr via feeding tube and increase by 10 ml every 4 hours to goal rate of 40 ml/hr.   30 ml Prostat TID.    Tube feeding regimen provides 1260 kcal (67% of needs), 129 grams of protein, and 802 ml of H2O.   NUTRITION DIAGNOSIS: Inadequate oral intake related to inability to eat as evidenced by NPO status  Goal: Enteral nutrition to provide 60-70% of estimated calorie needs (22-25 kcals/kg ideal body weight) and 100% of estimated protein needs, based on ASPEN guidelines for hypocaloric, high protein feeding in critically ill obese individuals  Monitor:  Respiratory status, TF initiation vs diet, labs  Reason for Assessment: Ventilator   28 y.o. female  Admitting Dx: Hydrocephalus  ASSESSMENT: Pt with no PMH admitted after being found down by husband.  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  Pt had ventricular drainage catheter inserted 12/23.   Patient is currently intubated on ventilator support MV: 9.1 L/min Temp (24hrs), Avg:98.9 F (37.2 C), Min:98.1 F (36.7 C), Max:99.4 F (37.4 C)  Propofol: none Pt has large bore NG tube in place currently.  Pt discussed during ICU rounds and with RN. Per RN pt more awake and alert this am.   Height: Ht Readings from Last 1 Encounters:  08/02/14 5\' 1"  (1.549 m)    Weight: Wt Readings from Last 1 Encounters:  08/02/14 219 lb (99.338 kg)    Ideal Body Weight: 47.7 kg  % Ideal Body Weight: 208%  Wt Readings from Last 10 Encounters:  08/02/14 219 lb (99.338 kg)  12/31/11 219 lb 3.2 oz (99.428 kg)  12/25/11 230 lb 9.6 oz (104.599 kg)    Usual Body Weight: unknown  % Usual Body Weight: -  BMI:  There is no weight on file to calculate BMI.  Estimated Nutritional Needs: Kcal: 1882 Protein: >/= 119 grams Fluid: > 1.8 L/day  Skin: WDL  Diet Order: Diet  NPO time specified  EDUCATION NEEDS: -No education needs identified at this time   Intake/Output Summary (Last 24 hours) at 08/02/14 1035 Last data filed at 08/02/14 0900  Gross per 24 hour  Intake 1329.58 ml  Output    553 ml  Net 776.58 ml    Last BM: PTA   Labs:   Recent Labs Lab 08/01/14 2108 08/02/14 0635  NA 138 136  K 4.2 3.5  CL 106 106  CO2 25 20  BUN 10 6  CREATININE 0.52 0.61  CALCIUM 9.0 8.1*  MG  --  1.9  PHOS  --  3.0  GLUCOSE 117* 138*    CBG (last 3)  No results for input(s): GLUCAP in the last 72 hours.  Scheduled Meds: . antiseptic oral rinse  7 mL Mouth Rinse QID  . chlorhexidine  15 mL Mouth Rinse BID  . heparin  5,000 Units Subcutaneous 3 times per day  . pantoprazole (PROTONIX) IV  40 mg Intravenous QHS    Continuous Infusions: . sodium chloride 1,000 mL (08/02/14 0035)    Past Medical History  Diagnosis Date  . Late prenatal care   . Headache     History reviewed. No pertinent past surgical history.  Kendell BaneHeather Dalvin Clipper RD, LDN, CNSC [phone] Pager [phone] After Hours Pager

## 2014-08-02 NOTE — Progress Notes (Signed)
Pt received from [facility]. Placed on settings found documented at [facility]. Pt tolerating well.

## 2014-08-02 NOTE — H&P (Signed)
PULMONARY / CRITICAL CARE MEDICINE   Name: Wendy Delgado MRN: [id number] DOB: [birth year]    ADMISSION DATE:  08/01/2014 CONSULTATION DATE:  08/02/2014  REFERRING MD :  [facility]  CHIEF COMPLAINT:  Status Epilepticus  INITIAL PRESENTATION:  28 y.o. F taken to [facility] on 12/22 after being found unresponsive by husband. Pt reportedly had severe headache earlier in afternoon.  While in ED, pt reportedly had a seizure which did not break and led to status epilepticus.  She was intubated and [facility].    STUDIES:  CT Head >>> no hemorrhage, moderate hydrocephalus  SIGNIFICANT EVENTS: 12/22 - taken to [facility] after being found unresponsive.  Reportedly had seizures and status, intubated and [facility]   HISTORY OF PRESENT ILLNESS:  Pt is encephalopathic; therefore, this HPI is obtained from chart review. Wendy Delgado is a 28 y.o. F with no significant PMH.  [facility] after her husband found her unresponsive at their home.  She apparently had a severe headache earlier in the afternoon, did not seek attention then. Has hx of mild headaches in past. Reportedly while in ED, pt suffered a seizure which then led to status.  She was intubated by EDP and [facility] for [facility]. PCCM was consulted for admission.   PAST MEDICAL HISTORY :   has a past medical history of Late prenatal care and Headache.  has no past surgical history on file. Prior to Admission medications   Medication Sig Start Date End Date Taking? Authorizing Provider  acetaminophen (TYLENOL) 500 MG tablet Take 500 mg by mouth every 6 (six) hours as needed for mild pain or moderate pain.   Yes Historical Provider, MD  diclofenac (VOLTAREN) 50 MG EC tablet Take 50 mg by mouth 2 (two) times daily.   Yes Historical Provider, MD  naproxen (NAPROSYN) 500 MG tablet Take 500 mg by mouth 2 (two) times daily with a meal.   Yes Historical Provider, MD   UNKNOWN TO PATIENT Take 1 tablet by mouth daily. BIRTH CONTROL   Yes Historical Provider, MD   No Known Allergies  FAMILY HISTORY:  Family History  Problem Relation Age of Onset  . Diabetes      SOCIAL HISTORY:  reports that she has never smoked. She has never used smokeless tobacco. She reports that she does not drink alcohol or use illicit drugs.  REVIEW OF SYSTEMS:  Unable to obtain as pt is encephalopathic.  SUBJECTIVE: No events overnight.  VITAL SIGNS: Temp:  [99.3 F (37.4 C)] 99.3 F (37.4 C) (12/22 2218) Pulse Rate:  [55-82] 82 (12/22 2300) Resp:  [16-20] 18 (12/22 2300) BP: (120-135)/(59-78) 123/59 mmHg (12/22 2300) SpO2:  [95 %-100 %] 100 % (12/22 2300) FiO2 (%):  [100 %] 100 % (12/23 0033) HEMODYNAMICS:   VENTILATOR SETTINGS: Vent Mode:  [-] PRVC FiO2 (%):  [100 %] 100 % Set Rate:  [16 bmp] 16 bmp Vt Set:  [500 mL] 500 mL PEEP:  [5 cmH20] 5 cmH20 Plateau Pressure:  [32 cmH20] 32 cmH20 INTAKE / OUTPUT: Intake/Output    None    PHYSICAL EXAMINATION: General: Young female, in NAD. Neuro: Non-responsive. HEENT: [place]/AT. Pupils very sluggish, sclerae anicteric. Cardiovascular: RRR, no M/R/G.  Lungs: Respirations even and unlabored.  CTA bilaterally, No W/R/R.  Abdomen: BS x 4, soft, NT/ND.  Musculoskeletal: No gross deformities, no edema. Bilateral ankles plantar flexed. Skin: Intact, warm, no rashes.  LABS:  CBC  Recent Labs Lab 08/01/14 2108  WBC 13.8*  HGB 14.0  HCT 41.4  PLT 285   Coag's No results for input(s): APTT, INR in the last 168 hours. BMET  Recent Labs Lab 08/01/14 2108  NA 138  K 4.2  CL 106  CO2 25  BUN 10  CREATININE 0.52  GLUCOSE 117*   Electrolytes  Recent Labs Lab 08/01/14 2108  CALCIUM 9.0   Sepsis Markers  Recent Labs Lab 08/01/14 2123  LATICACIDVEN 1.5   ABG No results for input(s): PHART, PCO2ART, PO2ART in the last 168 hours. Liver Enzymes  Recent Labs Lab 08/01/14 2108  AST 16  ALT 15   ALKPHOS 79  BILITOT 0.5  ALBUMIN 4.1   Cardiac Enzymes No results for input(s): TROPONINI, PROBNP in the last 168 hours. Glucose No results for input(s): GLUCAP in the last 168 hours.  Imaging Ct Head Wo Contrast  08/01/2014   CLINICAL DATA:  Altered mental status  EXAM: CT HEAD WITHOUT CONTRAST  TECHNIQUE: Contiguous axial images were obtained from the base of the skull through the vertex without intravenous contrast.  COMPARISON:  None.  FINDINGS: No intracranial hemorrhage or midline shift. No skull fracture is noted. Paranasal sinuses and mastoid air cells are unremarkable.  There is moderate hydrocephalus. The gray and white-matter differentiation is preserved. No definite acute cortical infarction. No intra or extra-axial fluid collection.  IMPRESSION: No intracranial hemorrhage or midline shift. Moderate hydrocephalus. The gray and white-matter differentiation is preserved. No definite acute cortical infarction.   Electronically Signed   By: Natasha MeadLiviu  Pop M.D.   On: 08/01/2014 21:32   Dg Chest Portable 1 View  08/01/2014   CLINICAL DATA:  Intubation, unresponsive  EXAM: PORTABLE CHEST - 1 VIEW  COMPARISON:  None.  FINDINGS: Endotracheal tube tip terminates 7 mm over the right mainstem bronchus. Nasogastric tube tip terminates below the level of the hemidiaphragms but is not included in the field of view. Lungs are hypoaerated with crowding of the bronchovascular markings. Bilateral linear atelectasis is present. Patchy retrocardiac opacity is suboptimally visualized. No pleural effusion. Heart size is normal.  IMPRESSION: Patchy bilateral atelectasis and retrocardiac opacity which could also represent atelectasis although early pneumonia could appear similar  Right mainstem bronchus intubation. Recommend pulling back endotracheal tube approximately 2.7 cm.  Critical Value/emergent results were called by telephone at the time of interpretation on 08/01/2014 at 11:43 pm to Dr. Mancel BaleELLIOTT WENTZ , who  verbally acknowledged these results.   Electronically Signed   By: Christiana PellantGretchen  Green M.D.   On: 08/01/2014 23:44    ASSESSMENT / PLAN:   NEUROLOGIC A:   Acute metabolic encephalopathy New onset seizures ? 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  P:   RASS goal: 0 to -1. Daily WUA. Neuro following. MRI / MRV noted. Neurosurgery consult appreciated. PRN versed. Fentanyl GTT.  PULMONARY OETT 12/22 >>> A: Acute respiratory failure P:   Full mechanical support, wean as able. VAP bundle. SBT in AM if able. ABG and CXR in AM.  CARDIOVASCULAR A:  No acute issues P:  Monitor hemodynamics.  RENAL A:   No acute issues P:   NS @ 125. Correct electrolytes as indicated. BMP in AM.  GASTROINTESTINAL A:   GI prophylaxis Nutrition P:   SUP: Pantoprazole. Consult nutrition for TF as per nutrition. TF if remains NPO > 24 hours.  HEMATOLOGIC A:   VTE Prophylaxis P:  SCD's / Heparin. CBC in AM.  INFECTIOUS A:  No obvious signs of infection P:   Monitor clinically.  ENDOCRINE A:   No known issues P:   SSI if glucose consistently > 180.  Family updated: None.  Interdisciplinary Family Meeting v Palliative Care Meeting:  Due by: 12/29.  Rutherford Guys, PA - [facility] Pgr: [phone]  or [phone] 08/02/2014, 12:55 AM  Above note edited in full, Neurocysticercosis with hydrocephalus s/p IVD placement, maintain on full vent support, hold weaning for now, sedate and will reevaluate in AM.  The patient is critically ill with multiple organ systems failure and requires high complexity decision making for assessment and support, frequent evaluation and titration of therapies, application of advanced monitoring technologies and extensive interpretation of multiple databases.   Critical Care Time devoted to patient care services described in this note is  35  Minutes. This time reflects time  of care of this signee Dr Koren Bound. This critical care time does not reflect procedure time, or teaching time or supervisory time of PA/NP/Med student/Med Resident etc but could involve care discussion time.  Wendy Delgado, M.D. [facility] Pulmonary/Critical Care Medicine. Pager: [phone]. After hours pager: [phone].

## 2014-08-02 NOTE — Progress Notes (Signed)
Pt transported to and from MRI with no complications. 

## 2014-08-02 NOTE — Progress Notes (Addendum)
Subjective: EVD placed recently  Exam: Filed Vitals:   08/02/14 0800  BP:   Pulse:   Temp: 99.4 F (37.4 C)  Resp:    Gen: In bed, NAD MS: does not open eyes.  QM:VHQICN:eyes dysconjugate but with noxious stimuli, they do approximate some but never become fully conjugate. Blinks to eyelid stimulation bilaterally. She does appear to have some reactivity in pupils bilaterally, though minimal.  Motor: withdraws to noxious stimuli in all four extremities.  Sensory:as above.    Impression: 28 yo F with obstructive hydrocephalus 2/2 likely neurocysticercosis. Unclear if she ever had seizures, but will get EEG.   Recommendations: 1) will send cysticercosis antibodies.  2) appreciate neurosurgical assistance.    Ritta SlotMcNeill Kinzie Wickes, MD [facility] [phone]  If 7pm- 7am, please page neurology on call as listed in AMION.

## 2014-08-02 NOTE — Consult Note (Signed)
Reason for Consult: Acute hydrocephalus Referring Physician: Dr. Josie Dixon is an 28 y.o. female.  HPI: Asian is a 28 year old Hispanic individual who became progressively obtunded and ultimately comatose. She required intubation and she was transferred from [facility]. CT scan around 9:30 this evening prior demonstrated acute hydrocephalus. 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 An emergency ventriculostomy is now required for decompression.  Past Medical History  Diagnosis Date  . Late prenatal care   . Headache     History reviewed. No pertinent past surgical history.  Family History  Problem Relation Age of Onset  . Diabetes      Social History:  reports that she has never smoked. She has never used smokeless tobacco. She reports that she does not drink alcohol or use illicit drugs.  Allergies: No Known Allergies  Medications: I have not reviewed the patient's medications  Results for orders placed or performed during the hospital encounter of 08/01/14 (from the past 48 hour(s))  CBC WITH DIFFERENTIAL     Status: Abnormal   Collection Time: 08/01/14  9:08 PM  Result Value Ref Range   WBC 13.8 (H) 4.0 - 10.5 K/uL   RBC 4.34 3.87 - 5.11 MIL/uL   Hemoglobin 14.0 12.0 - 15.0 g/dL   HCT 41.4 36.0 - 46.0 %   MCV 95.4 78.0 - 100.0 fL   MCH 32.3 26.0 - 34.0 pg   MCHC 33.8 30.0 - 36.0 g/dL   RDW 12.8 11.5 - 15.5 %   Platelets 285 150 - 400 K/uL   Neutrophils Relative % 85 (H) 43 - 77 %   Neutro Abs 11.7 (H) 1.7 - 7.7 K/uL   Lymphocytes Relative 12 12 - 46 %   Lymphs Abs 1.7 0.7 - 4.0 K/uL   Monocytes Relative 3 3 - 12 %   Monocytes Absolute 0.3 0.1 - 1.0 K/uL   Eosinophils Relative 0 0 - 5 %   Eosinophils Absolute 0.0 0.0 - 0.7 K/uL   Basophils Relative 0 0 - 1 %   Basophils Absolute 0.0 0.0 - 0.1 K/uL  Comprehensive metabolic panel     Status: Abnormal   Collection Time: 08/01/14  9:08 PM  Result Value Ref Range   Sodium 138 135 -  145 mmol/L    Comment: Please note change in reference range.   Potassium 4.2 3.5 - 5.1 mmol/L    Comment: Please note change in reference range.   Chloride 106 96 - 112 mEq/L   CO2 25 19 - 32 mmol/L   Glucose, Bld 117 (H) 70 - 99 mg/dL   BUN 10 6 - 23 mg/dL   Creatinine, Ser 0.52 0.50 - 1.10 mg/dL   Calcium 9.0 8.4 - 10.5 mg/dL   Total Protein 7.6 6.0 - 8.3 g/dL   Albumin 4.1 3.5 - 5.2 g/dL   AST 16 0 - 37 U/L   ALT 15 0 - 35 U/L   Alkaline Phosphatase 79 39 - 117 U/L   Total Bilirubin 0.5 0.3 - 1.2 mg/dL   GFR calc non Af Amer >90 >90 mL/min   GFR calc Af Amer >90 >90 mL/min    Comment: (NOTE) The eGFR has been calculated using the CKD EPI equation. This calculation has not been validated in all clinical situations. eGFR's persistently <90 mL/min signify possible Chronic Kidney Disease.    Anion gap 7 5 - 15  Ethanol     Status: None   Collection Time: 08/01/14  9:08 PM  Result Value Ref Range   Alcohol, Ethyl (B) <5 0 - 9 mg/dL    Comment:        LOWEST DETECTABLE LIMIT FOR SERUM ALCOHOL IS 11 mg/dL FOR MEDICAL PURPOSES ONLY   Ammonia     Status: None   Collection Time: 08/01/14  9:23 PM  Result Value Ref Range   Ammonia 13 11 - 32 umol/L    Comment: Please note change in reference range.  Lactic acid, plasma     Status: None   Collection Time: 08/01/14  9:23 PM  Result Value Ref Range   Lactic Acid, Venous 1.5 0.5 - 2.2 mmol/L  Ethanol     Status: None   Collection Time: 08/01/14  9:23 PM  Result Value Ref Range   Alcohol, Ethyl (B) <5 0 - 9 mg/dL    Comment:        LOWEST DETECTABLE LIMIT FOR SERUM ALCOHOL IS 11 mg/dL FOR MEDICAL PURPOSES ONLY   Drug screen panel, emergency     Status: None   Collection Time: 08/01/14  9:40 PM  Result Value Ref Range   Opiates NONE DETECTED NONE DETECTED   Cocaine NONE DETECTED NONE DETECTED   Benzodiazepines NONE DETECTED NONE DETECTED   Amphetamines NONE DETECTED NONE DETECTED   Tetrahydrocannabinol NONE DETECTED NONE  DETECTED   Barbiturates NONE DETECTED NONE DETECTED    Comment:        DRUG SCREEN FOR MEDICAL PURPOSES ONLY.  IF CONFIRMATION IS NEEDED FOR ANY PURPOSE, NOTIFY LAB WITHIN 5 DAYS.        LOWEST DETECTABLE LIMITS FOR URINE DRUG SCREEN Drug Class       Cutoff (ng/mL) Amphetamine      1000 Barbiturate      200 Benzodiazepine   482 Tricyclics       707 Opiates          300 Cocaine          300 THC              50   Urinalysis, Routine w reflex microscopic     Status: Abnormal   Collection Time: 08/01/14  9:40 PM  Result Value Ref Range   Color, Urine YELLOW YELLOW   APPearance CLEAR CLEAR   Specific Gravity, Urine 1.020 1.005 - 1.030   pH 7.5 5.0 - 8.0   Glucose, UA NEGATIVE NEGATIVE mg/dL   Hgb urine dipstick NEGATIVE NEGATIVE   Bilirubin Urine NEGATIVE NEGATIVE   Ketones, ur 40 (A) NEGATIVE mg/dL   Protein, ur NEGATIVE NEGATIVE mg/dL   Urobilinogen, UA 0.2 0.0 - 1.0 mg/dL   Nitrite NEGATIVE NEGATIVE   Leukocytes, UA NEGATIVE NEGATIVE    Comment: MICROSCOPIC NOT DONE ON URINES WITH NEGATIVE PROTEIN, BLOOD, LEUKOCYTES, NITRITE, OR GLUCOSE <1000 mg/dL.  MRSA PCR Screening     Status: None   Collection Time: 08/02/14 12:25 AM  Result Value Ref Range   MRSA by PCR NEGATIVE NEGATIVE    Comment:        The GeneXpert MRSA Assay (FDA approved for NASAL specimens only), is one component of a comprehensive MRSA colonization surveillance program. It is not intended to diagnose MRSA infection nor to guide or monitor treatment for MRSA infections.   Blood gas, arterial     Status: Abnormal   Collection Time: 08/02/14  4:52 AM  Result Value Ref Range   FIO2 0.40 %   Delivery systems VENTILATOR    Mode PRESSURE REGULATED VOLUME CONTROL  VT 500 mL   Rate 16 resp/min   Peep/cpap 5.0 cm H20   pH, Arterial 7.386 7.350 - 7.450   pCO2 arterial 33.8 (L) 35.0 - 45.0 mmHg   pO2, Arterial 141.0 (H) 80.0 - 100.0 mmHg   Bicarbonate 19.8 (L) 20.0 - 24.0 mEq/L   TCO2 20.9 0 - 100  mmol/L   Acid-base deficit 4.3 (H) 0.0 - 2.0 mmol/L   O2 Saturation 98.8 %   Patient temperature 98.6    Collection site LEFT RADIAL    Drawn by [id number]    Sample type ARTERIAL DRAW    Allens test (pass/fail) PASS PASS    Ct Head Wo Contrast  08/01/2014   CLINICAL DATA:  Altered mental status  EXAM: CT HEAD WITHOUT CONTRAST  TECHNIQUE: Contiguous axial images were obtained from the base of the skull through the vertex without intravenous contrast.  COMPARISON:  None.  FINDINGS: No intracranial hemorrhage or midline shift. No skull fracture is noted. Paranasal sinuses and mastoid air cells are unremarkable.  There is moderate hydrocephalus. The gray and white-matter differentiation is preserved. No definite acute cortical infarction. No intra or extra-axial fluid collection.  IMPRESSION: No intracranial hemorrhage or midline shift. Moderate hydrocephalus. The gray and white-matter differentiation is preserved. No definite acute cortical infarction.   Electronically Signed   By: Lahoma Crocker M.D.   On: 08/01/2014 21:32   Mr Jeri Cos DG Contrast  08/02/2014   CLINICAL DATA:  Patient found unresponsive. Reportedly had severe headache earlier today. Found to have hydrocephalus on prior CT. Initial encounter.  EXAM: MRI HEAD WITH CONTRAST  MRV HEAD WITHOUT CONTRAST  TECHNIQUE: Multiplanar, multiecho pulse sequences of the brain and surrounding structures were obtained with intravenous contrast. Angiographic images of the intracranial venous structures were obtained using MRV technique without intravenous contrast.  COMPARISON:  Prior CT from 08/01/2014.  CONTRAST:  18m MULTIHANCE GADOBENATE DIMEGLUMINE 529 MG/ML IV SOLN  FINDINGS: MRI HEAD FINDINGS:  Cerebral volume is within normal limits for patient age.  There is marked is dilatation of the lateral and third ventricles. The fourth ventricle remains normal in size. T2/FLAIR hyperintensity seen adjacent to the lateral ventricles, most prominent about the  temporal horns is consistent with transependymal flow of CSF. There is associated restricted diffusion along the anterior margin of the splenium and along the septum pellucidum and lateral margin of the right lateral ventricle. Findings consistent with obstructive hydrocephalus.  There is a heterogeneous mass measuring 14 x 17 x 14 mm located at the posterior/ floor of the third ventricle. This lesion is largely cystic with hyperintense T2 signal intensity with eighth vein peripheral rim. There are internal nodular components a do not definitely enhance on post-contrast imaging. Additionally, there is suggestion of possible additional small cysts located more superiorly within the lateral ventricles (series 10, images 12 and 13 in the lateral ventricles). Findings are suspicious for possible neurocysticercosis with multiple cystic intraventricular lesions. No parenchymal lesions identified within the brain.  There is no acute large vessel territory infarct. No intracranial hemorrhage. No other mass lesion or abnormal enhancement identified. There is no extra-axial fluid collection.  The craniocervical junction is grossly normal, although evaluation is limited on sagittal T1 weighted sequence due to susceptibility artifact. Pituitary gland normal. No acute abnormality seen about the orbits.  Paranasal sinuses are clear.  No mastoid effusion.  MRV HEAD FINDINGS:  Dedicated MRV images demonstrate no focal filling defect to suggest venous sinus thrombosis. The superior sagittal sinus is widely patent.  The transverse sinuses are patent, although the left transverse sinus is hypoplastic. Sigmoid sinuses and proximal jugular veins widely patent. The deep internal cerebral veins appear patent.  IMPRESSION: MRI IMPRESSION:  1. At 14 x 17 x 14 mm cystic intraventricular mass with internal nodularity positioned at the floor of the third ventricle. There is secondary obstruction of the cerebral aqueduct with obstructive  hydrocephalus involving the lateral and third ventricles and evidence of transependymal flow of CSF. There is suggestion of additional cystic intraventricular lesions located more superiorly within the lateral ventricles bilaterally. This constellation of findings suggests possible neurocysticercosis. No parenchymal lesions or abnormal enhancement identified elsewhere in the brain. 2. No other acute intracranial process.  MRV IMPRESSION:  Normal MRV without evidence for venous sinus thrombosis.   Electronically Signed   By: Jeannine Boga M.D.   On: 08/02/2014 05:18   Dg Chest Port 1 View  08/02/2014   CLINICAL DATA:  Endotracheal tube repositioning. Subsequent encounter.  EXAM: PORTABLE CHEST - 1 VIEW  COMPARISON:  Chest radiograph from 08/01/2014  FINDINGS: The patient's endotracheal tube is seen at the carina, with slight displacement of the carina. Would retract the endotracheal tube by approximately 3 cm. The endotracheal tube balloon appears slightly overinflated.  The patient's enteric tube is seen ending at the distal esophagus; this should be advanced approximately 10-12 cm. The stomach is diffusely distended with air.  The lungs are hypoexpanded. Minimal left basilar atelectasis is noted. No pleural effusion or pneumothorax is seen.  The cardiomediastinal silhouette is borderline normal in size. No acute osseous abnormalities are seen.  IMPRESSION: 1. Endotracheal tube seen ending at the carina, with slight displacement of the carina. Would retract the endotracheal tube by approximately 3 cm. The endotracheal tube balloon appears slightly overinflated. 2. Enteric tube noted ending at the distal esophagus. This should be advanced approximately 10-12 cm. The stomach is diffusely distended with air. 3. Lungs hypoexpanded, with minimal left basilar atelectasis.  These results were called by telephone at the time of interpretation on 08/02/2014 at 2:14 am to [place] RN on [facility], who verbally  acknowledged these results.   Electronically Signed   By: Garald Balding M.D.   On: 08/02/2014 02:15   Dg Chest Portable 1 View  08/01/2014   CLINICAL DATA:  Intubation, unresponsive  EXAM: PORTABLE CHEST - 1 VIEW  COMPARISON:  None.  FINDINGS: Endotracheal tube tip terminates 7 mm over the right mainstem bronchus. Nasogastric tube tip terminates below the level of the hemidiaphragms but is not included in the field of view. Lungs are hypoaerated with crowding of the bronchovascular markings. Bilateral linear atelectasis is present. Patchy retrocardiac opacity is suboptimally visualized. No pleural effusion. Heart size is normal.  IMPRESSION: Patchy bilateral atelectasis and retrocardiac opacity which could also represent atelectasis although early pneumonia could appear similar  Right mainstem bronchus intubation. Recommend pulling back endotracheal tube approximately 2.7 cm.  Critical Value/emergent results were called by telephone at the time of interpretation on 08/01/2014 at 11:43 pm to Dr. Daleen Bo , who verbally acknowledged these results.   Electronically Signed   By: Conchita Paris M.D.   On: 08/01/2014 23:44   Mr Mrv Head Wo Cm  08/02/2014   CLINICAL DATA:  Patient found unresponsive. Reportedly had severe headache earlier today. Found to have hydrocephalus on prior CT. Initial encounter.  EXAM: MRI HEAD WITH CONTRAST  MRV HEAD WITHOUT CONTRAST  TECHNIQUE: Multiplanar, multiecho pulse sequences of the brain and surrounding structures were obtained with intravenous  contrast. Angiographic images of the intracranial venous structures were obtained using MRV technique without intravenous contrast.  COMPARISON:  Prior CT from 08/01/2014.  CONTRAST:  64m MULTIHANCE GADOBENATE DIMEGLUMINE 529 MG/ML IV SOLN  FINDINGS: MRI HEAD FINDINGS:  Cerebral volume is within normal limits for patient age.  There is marked is dilatation of the lateral and third ventricles. The fourth ventricle remains normal in  size. T2/FLAIR hyperintensity seen adjacent to the lateral ventricles, most prominent about the temporal horns is consistent with transependymal flow of CSF. There is associated restricted diffusion along the anterior margin of the splenium and along the septum pellucidum and lateral margin of the right lateral ventricle. Findings consistent with obstructive hydrocephalus.  There is a heterogeneous mass measuring 14 x 17 x 14 mm located at the posterior/ floor of the third ventricle. This lesion is largely cystic with hyperintense T2 signal intensity with eighth vein peripheral rim. There are internal nodular components a do not definitely enhance on post-contrast imaging. Additionally, there is suggestion of possible additional small cysts located more superiorly within the lateral ventricles (series 10, images 12 and 13 in the lateral ventricles). Findings are suspicious for possible neurocysticercosis with multiple cystic intraventricular lesions. No parenchymal lesions identified within the brain.  There is no acute large vessel territory infarct. No intracranial hemorrhage. No other mass lesion or abnormal enhancement identified. There is no extra-axial fluid collection.  The craniocervical junction is grossly normal, although evaluation is limited on sagittal T1 weighted sequence due to susceptibility artifact. Pituitary gland normal. No acute abnormality seen about the orbits.  Paranasal sinuses are clear.  No mastoid effusion.  MRV HEAD FINDINGS:  Dedicated MRV images demonstrate no focal filling defect to suggest venous sinus thrombosis. The superior sagittal sinus is widely patent. The transverse sinuses are patent, although the left transverse sinus is hypoplastic. Sigmoid sinuses and proximal jugular veins widely patent. The deep internal cerebral veins appear patent.  IMPRESSION: MRI IMPRESSION:  1. At 14 x 17 x 14 mm cystic intraventricular mass with internal nodularity positioned at the floor of the  third ventricle. There is secondary obstruction of the cerebral aqueduct with obstructive hydrocephalus involving the lateral and third ventricles and evidence of transependymal flow of CSF. There is suggestion of additional cystic intraventricular lesions located more superiorly within the lateral ventricles bilaterally. This constellation of findings suggests possible neurocysticercosis. No parenchymal lesions or abnormal enhancement identified elsewhere in the brain. 2. No other acute intracranial process.  MRV IMPRESSION:  Normal MRV without evidence for venous sinus thrombosis.   Electronically Signed   By: BJeannine BogaM.D.   On: 08/02/2014 05:18    Review of Systems  Unable to perform ROS: acuity of condition   Blood pressure 131/65, pulse 62, temperature 98.1 F (36.7 C), temperature source Axillary, resp. rate 16, height 5' 1"  (1.549 m), SpO2 100 %, unknown if currently breastfeeding. Physical Exam  Constitutional: She appears well-developed and well-nourished.  Neurological:  Patient is on a ventilator. She has some flexion of her upper extremities and some extensor posturing of her lower extremities. Her pupils are 2 mm. The gaze is fixed downward. No reaction can be seen from the pupils.    Assessment/Plan: Obstructive hydrocephalus Emergency ventriculostomy  Omarrion Carmer J 08/02/2014, 6:11 AM

## 2014-08-03 ENCOUNTER — Inpatient Hospital Stay (HOSPITAL_COMMUNITY): Payer: Medicaid Other

## 2014-08-03 DIAGNOSIS — J9600 Acute respiratory failure, unspecified whether with hypoxia or hypercapnia: Secondary | ICD-10-CM

## 2014-08-03 LAB — CBC
HCT: 37.8 % (ref 36.0–46.0)
Hemoglobin: 12.3 g/dL (ref 12.0–15.0)
MCH: 31.3 pg (ref 26.0–34.0)
MCHC: 32.5 g/dL (ref 30.0–36.0)
MCV: 96.2 fL (ref 78.0–100.0)
PLATELETS: 242 10*3/uL (ref 150–400)
RBC: 3.93 MIL/uL (ref 3.87–5.11)
RDW: 13.5 % (ref 11.5–15.5)
WBC: 13.1 10*3/uL — ABNORMAL HIGH (ref 4.0–10.5)

## 2014-08-03 LAB — BLOOD GAS, ARTERIAL
Acid-base deficit: 2.9 mmol/L — ABNORMAL HIGH (ref 0.0–2.0)
Bicarbonate: 20.8 mEq/L (ref 20.0–24.0)
Drawn by: 308601
FIO2: 0.4 %
MECHVT: 500 mL
O2 Saturation: 99.2 %
PATIENT TEMPERATURE: 98.6
PEEP/CPAP: 5 cmH2O
PH ART: 7.423 (ref 7.350–7.450)
PO2 ART: 173 mmHg — AB (ref 80.0–100.0)
RATE: 16 resp/min
TCO2: 21.8 mmol/L (ref 0–100)
pCO2 arterial: 32.5 mmHg — ABNORMAL LOW (ref 35.0–45.0)

## 2014-08-03 LAB — BASIC METABOLIC PANEL WITH GFR
Anion gap: 6 (ref 5–15)
BUN: <5 mg/dL — ABNORMAL LOW (ref 6–23)
CO2: 23 mmol/L (ref 19–32)
Calcium: 8.4 mg/dL (ref 8.4–10.5)
Chloride: 114 meq/L — ABNORMAL HIGH (ref 96–112)
Creatinine, Ser: 0.53 mg/dL (ref 0.50–1.10)
GFR calc Af Amer: >90 mL/min
GFR calc non Af Amer: >90 mL/min
Glucose, Bld: 110 mg/dL — ABNORMAL HIGH (ref 70–99)
Potassium: 3.1 mmol/L — ABNORMAL LOW (ref 3.5–5.1)
Sodium: 143 mmol/L (ref 135–145)

## 2014-08-03 LAB — URINE CULTURE
Colony Count: NO GROWTH
Culture: NO GROWTH

## 2014-08-03 LAB — MAGNESIUM: Magnesium: 2 mg/dL (ref 1.5–2.5)

## 2014-08-03 LAB — HCG, SERUM, QUALITATIVE: Preg, Serum: NEGATIVE

## 2014-08-03 LAB — PHOSPHORUS: Phosphorus: 2 mg/dL — ABNORMAL LOW (ref 2.3–4.6)

## 2014-08-03 MED ORDER — POTASSIUM CHLORIDE 20 MEQ/15ML (10%) PO SOLN
40.0000 meq | Freq: Once | ORAL | Status: AC
  Administered 2014-08-03: 40 meq
  Filled 2014-08-03: qty 30

## 2014-08-03 MED ORDER — DEXTROSE 5 % IV SOLN
20.0000 meq | Freq: Once | INTRAVENOUS | Status: AC
  Administered 2014-08-03: 20 meq via INTRAVENOUS
  Filled 2014-08-03: qty 4.55

## 2014-08-03 NOTE — Progress Notes (Signed)
eLink Physician-Brief Progress Note Patient Name: Wendy Delgado DOB: 02/16/1986 MRN: [id number]   Date of Service  08/03/2014  HPI/Events of Note    eICU Interventions  K-phos repleted     Intervention Category Intermediate Interventions: Electrolyte abnormality - evaluation and management  [name] V. 08/03/2014, 4:17 AM

## 2014-08-03 NOTE — Consult Note (Addendum)
    [facility]     Reason for Consult: probable neurocysticercosis    Referring Physician: Dr. Amada JupiterKirkpatrick  Active Problems:   Status epilepticus   Acute encephalopathy   . antiseptic oral rinse  7 mL Mouth Rinse QID  . chlorhexidine  15 mL Mouth Rinse BID  . heparin  5,000 Units Subcutaneous 3 times per day  . 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  She has required IVC and considering cyst removal vs shunting.  I think for optimal treatment, she should be on steroids prior to antiparasitic therapy, though no urgency in antiparasitic treatment.   Antibiotics: none  HPI: Wendy Delgado is a 28 y[place] who presented to [facility] after being found unresponsive.  She had been complaining of a severe headache and had a generalized seizure in the ED.  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  She tells me that she has no history of seizures and no family history of seizures or known cysticercosis.    Review of Systems: A comprehensive review of systems was negative.  Past Medical History  Diagnosis Date  . Late prenatal care   . Headache     History  Substance Use Topics  . Smoking status: Never Smoker   . Smokeless tobacco: Never Used  . Alcohol Use: No    Family History  Problem Relation Age of Onset  . Diabetes     No Known Allergies  OBJECTIVE: Blood pressure 123/59, pulse 64, temperature 99.1 F (37.3 C), temperature source Oral, resp. rate 16, height 5\' 1"  (1.549 m), weight 204 lb 2.3 oz (92.6 kg), SpO2 100 %,  unknown if currently breastfeeding. General: awake, alert, does not complain of any pain Skin: no rashes Lungs: CTA B Cor: RRR without m Abdomen: soft, nt, nd Ext: no edema HEENT: IVC in place  Microbiology: Recent Results (from the past 240 hour(s))  Urine culture     Status: None   Collection Time: 08/01/14  9:40 PM  Result Value Ref Range Status   Specimen Description URINE, CATHETERIZED  Final   Special Requests NONE  Final   Culture  Setup Time   Final    08/02/2014 14:10 Performed at [facility]    Colony Count NO GROWTH Performed at [facility]   Final   Culture NO GROWTH Performed at [facility]   Final   Report Status 08/03/2014 FINAL  Final  MRSA PCR Screening     Status: None   Collection Time: 08/02/14 12:25 AM  Result Value Ref Range Status   MRSA by PCR NEGATIVE NEGATIVE Final    Comment:        The GeneXpert MRSA Assay (FDA approved for NASAL specimens only), is one component of a comprehensive MRSA colonization surveillance program. It is not intended to diagnose MRSA infection nor to guide or monitor treatment for MRSA infections.     Staci RighterOMER, Jayme Cham, MD [facility] [email or web address] [phone] pager  [phone] cell 08/03/2014, 1:20 PM

## 2014-08-03 NOTE — Progress Notes (Addendum)
Subjective: Continues to improve, wants tube out  Exam: Filed Vitals:   08/03/14 1700  BP: 109/56  Pulse: 72  Temp:   Resp: 16   Gen: In bed, NAD MS: awakens easily.  ZO:XWRUCN:eyes dysconjugate intially but conjugate with stimulation. Right pupil very slightly larger than left.  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 Serum Antibodies pending. IT appears on MRI that there are some other lateral ventrical cysts, though I am not sure if they are viable or not.   Recommendations: 1) Appreciate neurosurgical assistence.  2) Will discuss treatment options with ID.   Ritta SlotMcNeill Giovanni Bath, MD [facility] [phone]  If 7pm- 7am, please page neurology on call as listed in AMION.

## 2014-08-03 NOTE — Progress Notes (Signed)
Patient ID: Wendy Delgado, female   DOB: 08/23/1985, 28 y.o.   MRN: [id number] IVC draining. Vent weaning... Talked with husband via interpreter. Discussed options of direct aproach to cyst vs csf shunting Will remain with IVC for now.

## 2014-08-03 NOTE — Progress Notes (Signed)
PULMONARY / CRITICAL CARE MEDICINE   Name: Wendy Delgado MRN: [id number] DOB: 14-Dec-1985    ADMISSION DATE:  08/01/2014 CONSULTATION DATE:  08/03/2014  REFERRING MD :  [facility]H  CHIEF COMPLAINT:  Status Epilepticus  INITIAL PRESENTATION:  28 y.o. F taken to [facility] on 12/22 after being found unresponsive by husband. Pt reportedly had severe headache earlier in afternoon.  While in ED, pt reportedly had a seizure which did not break and led to status epilepticus.  She was intubated and [facility].    STUDIES:  CT Head >>> no hemorrhage, moderate hydrocephalus  SIGNIFICANT EVENTS: 12/22 - taken to [facility] after being found unresponsive.  Reportedly had seizures and status, intubated and [facility]   HISTORY OF PRESENT ILLNESS:  Pt is encephalopathic; therefore, this HPI is obtained from chart review. Wendy Delgado is a 28 y.o. F with no significant PMH.  [facility] after her husband found her unresponsive at their home.  She apparently had a severe headache earlier in the afternoon, did not seek attention then. Has hx of mild headaches in past. Reportedly while in ED, pt suffered a seizure which then led to status.  She was intubated by EDP and [facility]. PCCM was consulted for admission.   SUBJECTIVE: Awake  VITAL SIGNS: Temp:  [98.9 F (37.2 C)-99.9 F (37.7 C)] 99.1 F (37.3 C) (12/24 0721) Pulse Rate:  [52-93] 64 (12/24 0721) Resp:  [14-25] 16 (12/24 0721) BP: (78-147)/(42-104) 123/59 mmHg (12/24 0721) SpO2:  [97 %-100 %] 100 % (12/24 0721) FiO2 (%):  [40 %] 40 % (12/24 0721) Weight:  [204 lb 2.3 oz (92.6 kg)] 204 lb 2.3 oz (92.6 kg) (12/24 0530) HEMODYNAMICS:   VENTILATOR SETTINGS: Vent Mode:  [-] PSV;CPAP FiO2 (%):  [40 %] 40 % Set Rate:  [16 bmp] 16 bmp Vt Set:  [500 mL] 500 mL PEEP:  [5 cmH20] 5 cmH20 Pressure Support:  [10 cmH20] 10 cmH20 Plateau Pressure:  [16  cmH20-19 cmH20] 16 cmH20 INTAKE / OUTPUT: Intake/Output      12/23 0701 - 12/24 0700 12/24 0701 - 12/25 0700   I.V. (mL/kg) 3112.8 (33.6) 22.5 (0.2)   Other     IV Piggyback 84    Total Intake(mL/kg) 3196.8 (34.5) 22.5 (0.2)   Urine (mL/kg/hr) 1550 (0.7) 225 (0.5)   Drains 295 (0.1) 50 (0.1)   Total Output 1845 275   Net +1351.8 -252.5         PHYSICAL EXAMINATION: General: Young female, in NAD.Awake andalert Neuro: Follows commands HEENT: [place]/AT. Pupils very sluggish, sclerae anicteric. Cardiovascular: RRR, no M/R/G.  Lungs: Respirations even and unlabored.  CTA bilaterally, No W/R/R.  Abdomen: BS x 4, soft, NT/ND.  Musculoskeletal: No gross deformities, no edema. Bilateral ankles plantar flexed. Skin: Intact, warm, no rashes.  LABS:  CBC  Recent Labs Lab 08/01/14 2108 08/02/14 0635 08/03/14 0228  WBC 13.8* 16.7* 13.1*  HGB 14.0 12.7 12.3  HCT 41.4 39.0 37.8  PLT 285 277 242   Coag's No results for input(s): APTT, INR in the last 168 hours. BMET  Recent Labs Lab 08/01/14 2108 08/02/14 0635 08/03/14 0228  NA 138 136 143  K 4.2 3.5 3.1*  CL 106 106 114*  CO2 25 20 23   BUN 10 6 <5*  CREATININE 0.52 0.61 0.53  GLUCOSE 117* 138* 110*   Electrolytes  Recent Labs Lab 08/01/14 2108 08/02/14 [id number]  08/03/14 0228  CALCIUM 9.0 8.1* 8.4  MG  --  1.9 2.0  PHOS  --  3.0 2.0*   Sepsis Markers  Recent Labs Lab 08/01/14 2123  LATICACIDVEN 1.5   ABG  Recent Labs Lab 08/02/14 0452 08/03/14 0358  PHART 7.386 7.423  PCO2ART 33.8* 32.5*  PO2ART 141.0* 173.0*   Liver Enzymes  Recent Labs Lab 08/01/14 2108  AST 16  ALT 15  ALKPHOS 79  BILITOT 0.5  ALBUMIN 4.1   Cardiac Enzymes No results for input(s): TROPONINI, PROBNP in the last 168 hours. Glucose No results for input(s): GLUCAP in the last 168 hours.  Imaging Mr Laqueta JeanBrain W ZOWo Contrast  08/02/2014   CLINICAL DATA:  Patient found unresponsive. Reportedly had severe headache earlier today.  Found to have hydrocephalus on prior CT. Initial encounter.  EXAM: MRI HEAD WITH CONTRAST  MRV HEAD WITHOUT CONTRAST  TECHNIQUE: Multiplanar, multiecho pulse sequences of the brain and surrounding structures were obtained with intravenous contrast. Angiographic images of the intracranial venous structures were obtained using MRV technique without intravenous contrast.  COMPARISON:  Prior CT from 08/01/2014.  CONTRAST:  20mL MULTIHANCE GADOBENATE DIMEGLUMINE 529 MG/ML IV SOLN  FINDINGS: MRI HEAD FINDINGS:  Cerebral volume is within normal limits for patient age.  There is marked is dilatation of the lateral and third ventricles. The fourth ventricle remains normal in size. T2/FLAIR hyperintensity seen adjacent to the lateral ventricles, most prominent about the temporal horns is consistent with transependymal flow of CSF. There is associated restricted diffusion along the anterior margin of the splenium and along the septum pellucidum and lateral margin of the right lateral ventricle. Findings consistent with obstructive hydrocephalus.  There is a heterogeneous mass measuring 14 x 17 x 14 mm located at the posterior/ floor of the third ventricle. This lesion is largely cystic with hyperintense T2 signal intensity with eighth vein peripheral rim. There are internal nodular components a do not definitely enhance on post-contrast imaging. Additionally, there is suggestion of possible additional small cysts located more superiorly within the lateral ventricles (series 10, images 12 and 13 in the lateral ventricles). Findings are suspicious for possible neurocysticercosis with multiple cystic intraventricular lesions. No parenchymal lesions identified within the brain.  There is no acute large vessel territory infarct. No intracranial hemorrhage. No other mass lesion or abnormal enhancement identified. There is no extra-axial fluid collection.  The craniocervical junction is grossly normal, although evaluation is limited  on sagittal T1 weighted sequence due to susceptibility artifact. Pituitary gland normal. No acute abnormality seen about the orbits.  Paranasal sinuses are clear.  No mastoid effusion.  MRV HEAD FINDINGS:  Dedicated MRV images demonstrate no focal filling defect to suggest venous sinus thrombosis. The superior sagittal sinus is widely patent. The transverse sinuses are patent, although the left transverse sinus is hypoplastic. Sigmoid sinuses and proximal jugular veins widely patent. The deep internal cerebral veins appear patent.  IMPRESSION: MRI IMPRESSION:  1. At 14 x 17 x 14 mm cystic intraventricular mass with internal nodularity positioned at the floor of the third ventricle. There is secondary obstruction of the cerebral aqueduct with obstructive hydrocephalus involving the lateral and third ventricles and evidence of transependymal flow of CSF. There is suggestion of additional cystic intraventricular lesions located more superiorly within the lateral ventricles bilaterally. This constellation of findings suggests possible neurocysticercosis. No parenchymal lesions or abnormal enhancement identified elsewhere in the brain. 2. No other acute intracranial process.  MRV IMPRESSION:  Normal MRV without evidence for venous  sinus thrombosis.   Electronically Signed   By: Rise Mu M.D.   On: 08/02/2014 05:18   Dg Chest Port 1 View  08/02/2014   CLINICAL DATA:  Endotracheal tube repositioning. Subsequent encounter.  EXAM: PORTABLE CHEST - 1 VIEW  COMPARISON:  Chest radiograph from 08/01/2014  FINDINGS: The patient's endotracheal tube is seen at the carina, with slight displacement of the carina. Would retract the endotracheal tube by approximately 3 cm. The endotracheal tube balloon appears slightly overinflated.  The patient's enteric tube is seen ending at the distal esophagus; this should be advanced approximately 10-12 cm. The stomach is diffusely distended with air.  The lungs are hypoexpanded.  Minimal left basilar atelectasis is noted. No pleural effusion or pneumothorax is seen.  The cardiomediastinal silhouette is borderline normal in size. No acute osseous abnormalities are seen.  IMPRESSION: 1. Endotracheal tube seen ending at the carina, with slight displacement of the carina. Would retract the endotracheal tube by approximately 3 cm. The endotracheal tube balloon appears slightly overinflated. 2. Enteric tube noted ending at the distal esophagus. This should be advanced approximately 10-12 cm. The stomach is diffusely distended with air. 3. Lungs hypoexpanded, with minimal left basilar atelectasis.  These results were called by telephone at the time of interpretation on 08/02/2014 at 2:14 am to [place] RN on [facility], who verbally acknowledged these results.   Electronically Signed   By: Roanna Raider M.D.   On: 08/02/2014 02:15   Dg Abd Portable 1v  08/02/2014   CLINICAL DATA:  OG tube insertion.  EXAM: PORTABLE ABDOMEN - 1 VIEW  COMPARISON:  No prior.  FINDINGS: Orogastric tube noted projected over the stomach. The gas pattern is nonspecific. Stool is noted throughout the colon. No free air. No acute bony abnormalities identified.  IMPRESSION: Orogastric tube noted projected over the stomach.   Electronically Signed   By: Maisie Fus  Register   On: 08/02/2014 15:47   Mr Mrv Head Wo Cm  08/02/2014   CLINICAL DATA:  Patient found unresponsive. Reportedly had severe headache earlier today. Found to have hydrocephalus on prior CT. Initial encounter.  EXAM: MRI HEAD WITH CONTRAST  MRV HEAD WITHOUT CONTRAST  TECHNIQUE: Multiplanar, multiecho pulse sequences of the brain and surrounding structures were obtained with intravenous contrast. Angiographic images of the intracranial venous structures were obtained using MRV technique without intravenous contrast.  COMPARISON:  Prior CT from 08/01/2014.  CONTRAST:  20mL MULTIHANCE GADOBENATE DIMEGLUMINE 529 MG/ML IV SOLN  FINDINGS: MRI HEAD FINDINGS:   Cerebral volume is within normal limits for patient age.  There is marked is dilatation of the lateral and third ventricles. The fourth ventricle remains normal in size. T2/FLAIR hyperintensity seen adjacent to the lateral ventricles, most prominent about the temporal horns is consistent with transependymal flow of CSF. There is associated restricted diffusion along the anterior margin of the splenium and along the septum pellucidum and lateral margin of the right lateral ventricle. Findings consistent with obstructive hydrocephalus.  There is a heterogeneous mass measuring 14 x 17 x 14 mm located at the posterior/ floor of the third ventricle. This lesion is largely cystic with hyperintense T2 signal intensity with eighth vein peripheral rim. There are internal nodular components a do not definitely enhance on post-contrast imaging. Additionally, there is suggestion of possible additional small cysts located more superiorly within the lateral ventricles (series 10, images 12 and 13 in the lateral ventricles). Findings are suspicious for possible neurocysticercosis with multiple cystic intraventricular lesions. No parenchymal lesions identified  within the brain.  There is no acute large vessel territory infarct. No intracranial hemorrhage. No other mass lesion or abnormal enhancement identified. There is no extra-axial fluid collection.  The craniocervical junction is grossly normal, although evaluation is limited on sagittal T1 weighted sequence due to susceptibility artifact. Pituitary gland normal. No acute abnormality seen about the orbits.  Paranasal sinuses are clear.  No mastoid effusion.  MRV HEAD FINDINGS:  Dedicated MRV images demonstrate no focal filling defect to suggest venous sinus thrombosis. The superior sagittal sinus is widely patent. The transverse sinuses are patent, although the left transverse sinus is hypoplastic. Sigmoid sinuses and proximal jugular veins widely patent. The deep internal  cerebral veins appear patent.  IMPRESSION: MRI IMPRESSION:  1. At 14 x 17 x 14 mm cystic intraventricular mass with internal nodularity positioned at the floor of the third ventricle. There is secondary obstruction of the cerebral aqueduct with obstructive hydrocephalus involving the lateral and third ventricles and evidence of transependymal flow of CSF. There is suggestion of additional cystic intraventricular lesions located more superiorly within the lateral ventricles bilaterally. This constellation of findings suggests possible neurocysticercosis. No parenchymal lesions or abnormal enhancement identified elsewhere in the brain. 2. No other acute intracranial process.  MRV IMPRESSION:  Normal MRV without evidence for venous sinus thrombosis.   Electronically Signed   By: Rise Mu M.D.   On: 08/02/2014 05:18    ASSESSMENT / PLAN:   NEUROLOGIC A:   Acute metabolic encephalopathy(resolved 12/24) New onset seizures ? 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  P:   RASS goal: 0  Neuro following. MRI / MRV noted. Neurosurgery consult appreciated.   PULMONARY OETT 12/22 >>>12/24 A: Acute respiratory failure P:   Extubate 12/24 Pulmonary toilet  CARDIOVASCULAR A:  No acute issues P:  Monitor hemodynamics.  RENAL A:   No acute issues P:   NS @ 75 Correct electrolytes as indicated. BMP in AM.  GASTROINTESTINAL A:   GI prophylaxis Nutrition P:   SUP: Pantoprazole. Swallow eval 12/24  HEMATOLOGIC A:   VTE Prophylaxis P:  SCD's / Heparin. CBC  INFECTIOUS A:   No obvious signs of infection P:   Monitor clinically.  ENDOCRINE A:   No known issues P:   SSI if glucose consistently > 180.  Family updated: None.  Interdisciplinary Family Meeting v Palliative Care Meeting:  Due by: 12/29.  Brett Canales Minor ACNP Ad[facility] Pollack PCCM Pager [phone] till 3 pm If no answer page [phone] 08/03/2014, 11:37 AM  Patient is much more  awake and following commands this AM.  Weaning very well.  Will extubate and monitor airway.  If deteriorates will reintubate.  Will check swallow evaluation prior to giving any PO medications.  The patient is critically ill with multiple organ systems failure and requires high complexity decision making for assessment and support, frequent evaluation and titration of therapies, application of advanced monitoring technologies and extensive interpretation of multiple databases.   Critical Care Time devoted to patient care services described in this note is  35  Minutes. This time reflects time of care of this signee Dr Koren Bound. This critical care time does not reflect procedure time, or teaching time or supervisory time of PA/NP/Med student/Med Resident etc but could involve care discussion time.  Alyson Reedy, M.D. [facility] Pulmonary/Critical Care Medicine. Pager: [phone]. After hours pager: [phone].

## 2014-08-03 NOTE — Procedures (Signed)
Extubation Procedure Note  Patient Details:   Name: Wendy Delgado DOB: [birth year] MRN: [id number]   Airway Documentation:     Evaluation  O2 sats: stable throughout Complications: No apparent complications Patient did tolerate procedure well. Bilateral Breath Sounds: Diminished Suctioning: Airway Yes. Ext[facility]ated [facility] Dr[facility].  Pt vitals are stable.  Sats 100%.  Pt did speak.  [name] V 08/03/2014, 11:48 AM

## 2014-08-03 NOTE — Progress Notes (Signed)
Moved ET tube back 2CM  At 19 at lips.

## 2014-08-04 ENCOUNTER — Inpatient Hospital Stay (HOSPITAL_COMMUNITY): Payer: Medicaid Other

## 2014-08-04 DIAGNOSIS — B690 Cysticercosis of central nervous system: Secondary | ICD-10-CM

## 2014-08-04 LAB — CBC
HCT: 35.1 % — ABNORMAL LOW (ref 36.0–46.0)
Hemoglobin: 11.5 g/dL — ABNORMAL LOW (ref 12.0–15.0)
MCH: 31.3 pg (ref 26.0–34.0)
MCHC: 32.8 g/dL (ref 30.0–36.0)
MCV: 95.4 fL (ref 78.0–100.0)
PLATELETS: 214 10*3/uL (ref 150–400)
RBC: 3.68 MIL/uL — ABNORMAL LOW (ref 3.87–5.11)
RDW: 12.7 % (ref 11.5–15.5)
WBC: 10.2 10*3/uL (ref 4.0–10.5)

## 2014-08-04 LAB — BASIC METABOLIC PANEL
ANION GAP: 9 (ref 5–15)
BUN: <5 mg/dL — ABNORMAL LOW (ref 6–23)
CO2: 22 mmol/L (ref 19–32)
CREATININE: 0.45 mg/dL — AB (ref 0.50–1.10)
Calcium: 8.4 mg/dL (ref 8.4–10.5)
Chloride: 109 mEq/L (ref 96–112)
GFR calc non Af Amer: >90 mL/min (ref >90)
Glucose, Bld: 81 mg/dL (ref 70–99)
Potassium: 3.4 mmol/L — ABNORMAL LOW (ref 3.5–5.1)
Sodium: 140 mmol/L (ref 135–145)

## 2014-08-04 MED ORDER — POTASSIUM CHLORIDE 10 MEQ/50ML IV SOLN
10.0000 meq | INTRAVENOUS | Status: DC
  Filled 2014-08-04: qty 50

## 2014-08-04 MED ORDER — POTASSIUM CHLORIDE 10 MEQ/100ML IV SOLN
10.0000 meq | INTRAVENOUS | Status: AC
  Administered 2014-08-04 (×2): 10 meq via INTRAVENOUS
  Filled 2014-08-04 (×2): qty 100

## 2014-08-04 MED ORDER — ENOXAPARIN SODIUM 40 MG/0.4ML ~~LOC~~ SOLN
40.0000 mg | Freq: Every day | SUBCUTANEOUS | Status: DC
  Filled 2014-08-04: qty 0.4

## 2014-08-04 MED ORDER — SODIUM CHLORIDE 0.9 % IV SOLN: 1000.0000 mL | INTRAVENOUS | Status: DC

## 2014-08-04 MED ORDER — ENOXAPARIN SODIUM 60 MG/0.6ML ~~LOC~~ SOLN
50.0000 mg | Freq: Every day | SUBCUTANEOUS | Status: DC
  Administered 2014-08-04 – 2014-08-12 (×8): 50 mg via SUBCUTANEOUS
  Filled 2014-08-04 (×10): qty 0.6

## 2014-08-04 NOTE — Progress Notes (Signed)
I spoke with pt's husband, Wendy Delgado.  He and his family, including their 2 children, have been sleeping here since his wife has been in the hospital.  They live about 30 minutes away and finances are a concern, including gas prices.  He needs to return to work tomorrow, but wants to be able to speak with the doctor when they come to talk with him about the when the surgery will take place and what type of surgery will happen for his wife.  If at all possible, please let his family know when doctors will be coming to talk with them with some definitive answers so that he can take the time off to come and be present for the conversation.  I would recommend that Social Work talk with them again given that the family is staying here at the hospital with two children.  [employer] CorporationChaplain Katy Gary Bultman Pager, [phone] 4:07 PM   I spoke with pt's husband, Wendy Delgado.  He and his family, including their 2 children, have been sleeping here since his wife has been in the hospital.  They live about 30 minutes away and finances are a concern, including gas prices.  He needs to return to work tomorrow, but wants to be able to speak with the doctor when they come to talk with him about the when the surgery will take place and what type of surgery will happen for his wife.  If at all possible, please let his family know when doctors will be coming to talk with them with some definitive answers so that he can take the time off to come and be present for the conversation.  I would recommend that Social Work talk with them again given that the family is staying here at the hospital with two children.  [employer] CorporationChaplain Katy Helina Hullum Pager, [phone] 4:07 PM

## 2014-08-04 NOTE — Progress Notes (Signed)
Subjective: Extubated, appropriate per nursing.   Exam: Filed Vitals:   08/04/14 0800  BP: 136/72  Pulse: 76  Temp:   Resp: 16   Gen: In bed, NAD MS: awakens easily. follows commands, answers questions though limited due to language barrier.  YQ:MVHQCN:eyes conjugate. Right pupil very slightly larger than left.  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 Serum Antibodies pending. It appears on MRI that there are some other lateral ventrical cysts, though I am not sure if they are viable or not. If the cyst is removed, tissue for PCR would be helpful to confirm diagnosis(though suspicion is very high).  Recommendations: 1) Appreciate neurosurgical assistence.  2) Appreciate ID assistence  Ritta SlotMcNeill Amaurie Schreckengost, MD [facility] [phone]  If 7pm- 7am, please page neurology on call as listed in AMION.

## 2014-08-04 NOTE — Progress Notes (Signed)
PULMONARY / CRITICAL CARE MEDICINE   Name: Wendy Delgado MRN: [id number] DOB: [birth year]    ADMISSION DATE:  08/01/2014 CONSULTATION DATE:  08/04/2014  REFERRING MD :  OSH  CHIEF COMPLAINT:  Status Epilepticus  INITIAL PRESENTATION:  28 y.o. F taken to [facility] on 12/22 after being found unresponsive by husband. Pt reportedly had severe headache earlier in afternoon.  While in ED, pt reportedly had a seizure which did not break and led to status epilepticus.  [facility]    STUDIES:  CT Head >>> no hemorrhage, moderate hydrocephalus  SIGNIFICANT EVENTS: 12/22 - taken to [facility] after being found unresponsive.  Reportedly had seizures and status, [facility]ub[facility]   SUBJECTIVE: Awake, afebrile Headache decreased  VITAL SIGNS: Temp:  [98.3 F (36.8 C)-99.3 F (37.4 C)] 98.6 F (37 C) (12/25 0725) Pulse Rate:  [43-98] 51 (12/25 0700) Resp:  [15-23] 15 (12/25 0700) BP: (91-127)/(44-84) 121/63 mmHg (12/25 0700) SpO2:  [98 %-100 %] 99 % (12/25 0700) Weight:  [95.2 kg (209 lb 14.1 oz)] 95.2 kg (209 lb 14.1 oz) (12/25 0500) HEMODYNAMICS:   VENTILATOR SETTINGS:   INTAKE / OUTPUT: Intake/Output      12/24 0701 - 12/25 0700 12/25 0701 - 12/26 0700   I.V. (mL/kg) 1397.5 (14.7)    Other 2    IV Piggyback     Total Intake(mL/kg) 1399.5 (14.7)    Urine (mL/kg/hr) 1725 (0.8)    Drains 364 (0.2)    Total Output 2089     Net -689.5           PHYSICAL EXAMINATION: General: Young female, in NAD.Awake andalert Neuro: Follows commands, non focal, speaks spanish HEENT: [place]/AT. Pupils very sluggish, sclerae anicteric. Cardiovascular: RRR, no M/R/G.  Lungs: Respirations even and unlabored.  CTA bilaterally, No W/R/R.  Abdomen: BS x 4, soft, NT/ND.  Musculoskeletal: No gross deformities, no edema. Skin: Intact, warm, no rashes.  LABS:  CBC  Recent Labs Lab 08/02/14 0635 08/03/14 0228 08/04/14 0327  WBC  16.7* 13.1* 10.2  HGB 12.7 12.3 11.5*  HCT 39.0 37.8 35.1*  PLT 277 242 214   Coag's No results for input(s): APTT, INR in the last 168 hours. BMET  Recent Labs Lab 08/02/14 0635 08/03/14 0228 08/04/14 0327  NA 136 143 140  K 3.5 3.1* 3.4*  CL 106 114* 109  CO2 20 23 22   BUN 6 <5* <5*  CREATININE 0.61 0.53 0.45*  GLUCOSE 138* 110* 81   Electrolytes  Recent Labs Lab 08/02/14 0635 08/03/14 0228 08/04/14 0327  CALCIUM 8.1* 8.4 8.4  MG 1.9 2.0  --   PHOS 3.0 2.0*  --    Sepsis Markers  Recent Labs Lab 08/01/14 2123  LATICACIDVEN 1.5   ABG  Recent Labs Lab 08/02/14 0452 08/03/14 0358  PHART 7.386 7.423  PCO2ART 33.8* 32.5*  PO2ART 141.0* 173.0*   Liver Enzymes  Recent Labs Lab 08/01/14 2108  AST 16  ALT 15  ALKPHOS 79  BILITOT 0.5  ALBUMIN 4.1   Cardiac Enzymes No results for input(s): TROPONINI, PROBNP in the last 168 hours. Glucose No results for input(s): GLUCAP in the last 168 hours.  Imaging Dg Chest Port 1 View  08/03/2014   CLINICAL DATA:  Endotracheal tube position  EXAM: PORTABLE CHEST - 1 VIEW  COMPARISON:  08/02/2014  FINDINGS: Endotracheal tube is approximately 5 mm above the carina and should be withdrawn 3 cm. NG tube  in the stomach  Hypoventilation with decreased lung volume compared with yesterday. Increased bibasilar atelectasis.  Negative for heart failure or effusion.  IMPRESSION: Endotracheal tube is just above the carina and should be withdrawn 3 cm.  Hypoventilation with increased bibasilar atelectasis compared with yesterday.   Electronically Signed   By: Marlan Palauharles  Clark M.D.   On: 08/03/2014 07:23    ASSESSMENT / PLAN:   NEUROLOGIC A:   Acute metabolic encephalopathy(resolved 12/24) New onset seizures Hydrocephalus s/p ventric Neurocysticercosis  P:   RASS goal: 0  Neuro following. Neurosurgery consult appreciated. PT/OT  PULMONARY OETT 12/22 >>>12/24 A: Acute respiratory failure P:   Extubate  12/24 Replete K   GASTROINTESTINAL A:   Nutrition P:  Sips & chips & advance Swallow eval   HEMATOLOGIC A:   VTE Prophylaxis P:  SCD's / Heparin. CBC  INFECTIOUS A:  Neuricysticercosis P:   Await ELISA . Would start treatment with steroids & antihelminthic while waiting  ENDOCRINE A:   No known issues P:   SSI if glucose consistently > 180.  Family updated: None.  Interdisciplinary Family Meeting v Palliative Care Meeting:  NA   Oretha MilchALVA,RAKESH V. MD 08/04/2014, 7:32 AM

## 2014-08-04 NOTE — Progress Notes (Signed)
Patient ID: Wendy HireFlorencia Vera, female   DOB: 10-12-1985, 28 y.o.   MRN: [id number] Afeb, vss Now awake alert, and conversant. The drain is working well. Will continue present rx. Further treatment per Dr Danielle DessElsner.

## 2014-08-04 NOTE — Evaluation (Signed)
Clinical/Bedside Swallow Evaluation Patient Details  Name: Wendy Delgado MRN: [id number] Date of Birth: March 21, 1986  Today's Date: 08/04/2014 Time: 1228-1239 SLP Time Calculation (min) (ACUTE ONLY): 11 min  Past Medical History:  Past Medical History  Diagnosis Date  . Late prenatal care   . Headache    Past Surgical History: History reviewed. No pertinent past surgical history. HPI:  28 y[place] who presented to [facility] after being found unresponsive. She had been complaining of a severe headache and had a generalized seizure in the ED. 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   She has required IVC and considering cyst removal vs shunting. Intubated 12/22-12/24   Assessment / Plan / Recommendation Clinical Impression  Pt presents with functional oropharyngeal swallow with adequate mastication, consistent swallow response.  Mild cough noted after <10% of thin liquid trials.  Recommend initiating a regular diet, thin liquids; meds whole in liquid.  D/W RN - will monitor for s/s of aspiration with meals.  SLP to f/u briefly for toleration.      Aspiration Risk  Mild    Diet Recommendation Regular;Thin liquid   Liquid Administration via: Cup;Straw Medication Administration: Whole meds with liquid Supervision: Patient able to self feed;Intermittent supervision to cue for compensatory strategies Compensations: Small sips/bites    Other  Recommendations Oral Care Recommendations: Oral care BID   Follow Up Recommendations  None    Frequency and Duration min 1 x/week  1 week       SLP Swallow Goals     Swallow Study Prior Functional Status       General Date of Onset: 08/01/14 HPI: 28 y.o. [place] who presented to [facility] after being found unresponsive. She had been complaining  of a severe headache and had a generalized seizure in the ED. 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   She has required IVC and considering cyst removal vs shunting. Intubated 12/22-12/24 Type of Study: Bedside swallow evaluation Diet Prior to this Study: Thin liquids Temperature Spikes Noted: No Respiratory Status: Room air History of Recent Intubation: Yes Length of Intubations (days): 2 days Date extubated: 08/03/14 Behavior/Cognition: Alert;Cooperative;Pleasant mood Oral Cavity - Dentition: Adequate natural dentition Self-Feeding Abilities: Able to feed self Patient Positioning: Upright in bed Baseline Vocal Quality: Clear Volitional Cough: Strong Volitional Swallow: Able to elicit    Oral/Motor/Sensory Function Overall Oral Motor/Sensory Function: Appears within functional limits for tasks assessed   Ice Chips Ice chips: Within functional limits   Thin Liquid Thin Liquid: Within functional limits Presentation: Cup;Straw    Nectar Thick Nectar Thick Liquid: Not tested   Honey Thick Honey Thick Liquid: Not tested   Puree Puree: Within functional limits   Solid  Wendy Delgado, [place] CCC/SLP Pager [phone]     Solid: Within functional limits       Blenda MountsCouture, Wendy Delgado Laurice 08/04/2014,12:46 PM

## 2014-08-05 DIAGNOSIS — G919 Hydrocephalus, unspecified: Secondary | ICD-10-CM | POA: Insufficient documentation

## 2014-08-05 DIAGNOSIS — B690 Cysticercosis of central nervous system: Secondary | ICD-10-CM | POA: Insufficient documentation

## 2014-08-05 MED ORDER — POTASSIUM CHLORIDE 10 MEQ/100ML IV SOLN
10.0000 meq | INTRAVENOUS | Status: AC
  Administered 2014-08-05 (×2): 10 meq via INTRAVENOUS
  Filled 2014-08-05 (×2): qty 100

## 2014-08-05 NOTE — Progress Notes (Signed)
Subjective: Patient resting comfortably in bed, without complaints. IVC draining well.  Objective: Vital signs in last 24 hours: Filed Vitals:   08/05/14 0600 08/05/14 0700 08/05/14 0800 08/05/14 0900  BP: 140/82 133/67 135/68 130/68  Pulse: 71 54 60 82  Temp:      TempSrc:      Resp: 24 18 20 24   Height:      Weight:      SpO2: 99% 98% 97% 100%    Intake/Output from previous day: 12/25 0701 - 12/26 0700 In: 2753.3 [I.V.:1103.3; IV Piggyback:200] Out: 1697 [Urine:1320; Drains:377] Intake/Output this shift: Total I/O In: 100 [IV Piggyback:100] Out: 242 [Urine:225; Drains:17]  Physical Exam:  Awake alert, per nurse's patient is oriented only to name, but speech is fluent, she has good comprehension, and she follows commands with all 4 extremity's.  Pupils equal, round, reactive to light. EOMI. Facial movements symmetrical. Moving all 4 extremities well.  CBC  Recent Labs  08/03/14 0228 08/04/14 0327  WBC 13.1* 10.2  HGB 12.3 11.5*  HCT 37.8 35.1*  PLT 242 214   BMET  Recent Labs  08/03/14 0228 08/04/14 0327  NA 143 140  K 3.1* 3.4*  CL 114* 109  CO2 23 22  GLUCOSE 110* 81  BUN <5* <5*  CREATININE 0.53 0.45*  CALCIUM 8.4 8.4    Studies/Results: Dg Chest Port 1 View  08/04/2014   CLINICAL DATA:  Shortness of breath and cough  EXAM: PORTABLE CHEST - 1 VIEW  COMPARISON:  08/03/2014  FINDINGS: The cardiac shadow is stable. The endotracheal tube and nasogastric catheter have been removed in the interval. The lungs are well aerated bilaterally. No focal infiltrate is noted. Mild residual right basilar atelectasis is seen.  IMPRESSION: Mild residual right basilar atelectasis. No new focal abnormality is noted.   Electronically Signed   By: Alcide CleverMark  Lukens M.D.   On: 08/04/2014 08:02    Assessment/Plan: Stable from a neurosurgical perspective. I've asked the patient's nurse to check with PCCM about whether there is any CSF analysis for the question of  neurocysticercosis, since this was raised as a diagnostic possibility by the neuroradiologist interpreting her MRI, and we have CSF available.   Hewitt ShortsNUDELMAN,ROBERT W, MD 08/05/2014, 9:28 AM

## 2014-08-05 NOTE — Progress Notes (Signed)
Subjective: No changes  Exam: Filed Vitals:   08/05/14 0700  BP: 133/67  Pulse: 54  Temp:   Resp: 18   Gen: In bed, NAD MS: awakens easily. follows commands, answers questions though limited due to language barrier.  ZO:XWRUCN:eyes conjugate. Pupils equal this morning.   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 Serum Antibodies pending. It appears on MRI that there are some other lateral ventrical cysts, though I am not sure if they are viable or not. If the cyst is removed, tissue for PCR would be helpful to confirm diagnosis(though suspicion is very high).  Recommendations: 1) Appreciate neurosurgical assistence.  2) Appreciate ID assistence  Ritta SlotMcNeill Kiren Mcisaac, MD [facility] [phone]  If 7pm- 7am, please page neurology on call as listed in AMION.

## 2014-08-05 NOTE — Progress Notes (Signed)
PULMONARY / CRITICAL CARE MEDICINE   Name: Wendy Delgado MRN: [id number] DOB: 1985/08/21    ADMISSION DATE:  08/01/2014 CONSULTATION DATE:  08/05/2014  REFERRING MD :  [facility]  CHIEF COMPLAINT:  Status Epilepticus  INITIAL PRESENTATION:  28 y.o. F taken to [facility] on 12/22 after being found unresponsive by husband. Pt reportedly had severe headache earlier in afternoon.  While in ED, pt reportedly had a seizure which did not break and led to status epilepticus.  [facility] intubated and [facility]    STUDIES:  CT Head >>> no hemorrhage, moderate hydrocephalus  SIGNIFICANT EVENTS: 12/22 - taken to [facility] after being found unresponsive.  Reportedly had seizures and status, intubated and [facility]   SUBJECTIVE: Awake, afebrile No Headache   VITAL SIGNS: Temp:  [99 F (37.2 C)-100.6 F (38.1 C)] 99 F (37.2 C) (12/26 0400) Pulse Rate:  [50-79] 71 (12/26 0600) Resp:  [16-24] 24 (12/26 0600) BP: (117-146)/(61-126) 140/82 mmHg (12/26 0600) SpO2:  [97 %-100 %] 99 % (12/26 0600) HEMODYNAMICS:   VENTILATOR SETTINGS:   INTAKE / OUTPUT: Intake/Output      12/25 0701 - 12/26 0700 12/26 0701 - 12/27 0700   I.V. (mL/kg) 1103.3 (11.6)    Other 1450    IV Piggyback 200    Total Intake(mL/kg) 2753.3 (28.9)    Urine (mL/kg/hr) 1320 (0.6)    Drains 377 (0.2)    Total Output 1697     Net +1056.3           PHYSICAL EXAMINATION: General: Young female, in NAD.Awake andalert Neuro: Follows commands, non focal, speaks spanish HEENT: [place]/AT. Pupils very sluggish, sclerae anicteric. Cardiovascular: RRR, no M/R/G.  Lungs: Respirations even and unlabored.  CTA bilaterally, No W/R/R.  Abdomen: BS x 4, soft, NT/ND.  Musculoskeletal: No gross deformities, no edema. Skin: Intact, warm, no rashes.  LABS:  CBC  Recent Labs Lab 08/02/14 0635 08/03/14 0228 08/04/14 0327  WBC 16.7* 13.1* 10.2  HGB 12.7 12.3 11.5*  HCT 39.0 37.8 35.1*  PLT 277 242  214   Coag's No results for input(s): APTT, INR in the last 168 hours. BMET  Recent Labs Lab 08/02/14 0635 08/03/14 0228 08/04/14 0327  NA 136 143 140  K 3.5 3.1* 3.4*  CL 106 114* 109  CO2 20 23 22   BUN 6 <5* <5*  CREATININE 0.61 0.53 0.45*  GLUCOSE 138* 110* 81   Electrolytes  Recent Labs Lab 08/02/14 0635 08/03/14 0228 08/04/14 0327  CALCIUM 8.1* 8.4 8.4  MG 1.9 2.0  --   PHOS 3.0 2.0*  --    Sepsis Markers  Recent Labs Lab 08/01/14 2123  LATICACIDVEN 1.5   ABG  Recent Labs Lab 08/02/14 0452 08/03/14 0358  PHART 7.386 7.423  PCO2ART 33.8* 32.5*  PO2ART 141.0* 173.0*   Liver Enzymes  Recent Labs Lab 08/01/14 2108  AST 16  ALT 15  ALKPHOS 79  BILITOT 0.5  ALBUMIN 4.1   Cardiac Enzymes No results for input(s): TROPONINI, PROBNP in the last 168 hours. Glucose No results for input(s): GLUCAP in the last 168 hours.  Imaging Dg Chest Port 1 View  08/04/2014   CLINICAL DATA:  Shortness of breath and cough  EXAM: PORTABLE CHEST - 1 VIEW  COMPARISON:  08/03/2014  FINDINGS: The cardiac shadow is stable. The endotracheal tube and nasogastric catheter have been removed in the interval. The lungs are well aerated bilaterally. No focal infiltrate is noted. Mild residual  right basilar atelectasis is seen.  IMPRESSION: Mild residual right basilar atelectasis. No new focal abnormality is noted.   Electronically Signed   By: Alcide CleverMark  Lukens M.D.   On: 08/04/2014 08:02    ASSESSMENT / PLAN:   NEUROLOGIC A:   Acute metabolic encephalopathy(resolved 12/24) New onset seizures Hydrocephalus s/p ventric Neurocysticercosis  P:   RASS goal: 0  Await plan from Neurosurgery &ID PT/OT  PULMONARY OETT 12/22 >>>12/24 A: Acute respiratory failure P:  IS while in bed     GASTROINTESTINAL A:   Nutrition P:  Reg diet Replete K   HEMATOLOGIC A:   VTE Prophylaxis P:  SCD's / Heparin. CBC  INFECTIOUS A:  Neuricysticercosis P:   Await ELISA .  Would start treatment with steroids & antihelminthic while waiting -defer to ID  ENDOCRINE A:   No known issues P:   SSI if glucose consistently > 180.  Family updated: None.  Interdisciplinary Family Meeting v Palliative Care Meeting:  NA  Summary -  Await  Plan - surgery vs medical Rx - stay in ICU until drain [facility],Ezell Poke V. MD 08/05/2014, 7:31 AM

## 2014-08-05 NOTE — Progress Notes (Signed)
[facility]    Subjective: No new complaints   Antibiotics:  Anti-infectives    None      Medications: Scheduled Meds: . enoxaparin (LOVENOX) injection  50 mg Subcutaneous Daily   Continuous Infusions:  PRN Meds:.sodium chloride, sodium chloride, acetaminophen    Objective: Weight change:   Intake/Output Summary (Last 24 hours) at 08/05/14 1741 Last data filed at 08/05/14 1700  Gross per 24 hour  Intake   1890 ml  Output   2838 ml  Net   -948 ml   Blood pressure 101/56, pulse 75, temperature 99.5 F (37.5 C), temperature source Axillary, resp. rate 21, height 5\' 1"  (1.549 m), weight 203 lb 7.8 oz (92.3 kg), SpO2 92 %, unknown if currently breastfeeding. Temp:  [98 F (36.7 C)-100.6 F (38.1 C)] 99.5 F (37.5 C) (12/26 1550) Pulse Rate:  [50-95] 75 (12/26 1700) Resp:  [16-25] 21 (12/26 1700) BP: (100-141)/(54-126) 101/56 mmHg (12/26 1700) SpO2:  [92 %-100 %] 92 % (12/26 1700) Weight:  [203 lb 7.8 oz (92.3 kg)] 203 lb 7.8 oz (92.3 kg) (12/26 0800)  Physical Exam: General: Alert and awake, oriented x3, not in any acute distress. HEENT: IV drain in place   EOMI CVS regular rate, normal r Chest:  no wheezing Abdomen: soft nontender, nondistended, Extremities: no  clubbing or edema noted bilaterally Skin: no rashes Neuro: nonfocal  CBC:  CBC Latest Ref Rng 08/04/2014 08/03/2014 08/02/2014  WBC 4.0 - 10.5 K/uL 10.2 13.1(H) 16.7(H)  Hemoglobin 12.0 - 15.0 g/dL 11.5(L) 12.3 12.7  Hematocrit 36.0 - 46.0 % 35.1(L) 37.8 39.0  Platelets 150 - 400 K/uL 214 242 277      BMET  Recent Labs  08/03/14 0228 08/04/14 0327  NA 143 140  K 3.1* 3.4*  CL 114* 109  CO2 23 22  GLUCOSE 110* 81  BUN <5* <5*  CREATININE 0.53 0.45*  CALCIUM 8.4 8.4     Liver Panel  No results for input(s): PROT, ALBUMIN, AST, ALT, ALKPHOS, BILITOT, BILIDIR, IBILI in the last 72 hours.     Sedimentation Rate No results for input(s):  ESRSEDRATE in the last 72 hours. C-Reactive Protein No results for input(s): CRP in the last 72 hours.  Micro Results: Recent Results (from the past 720 hour(s))  Urine culture     Status: None   Collection Time: 08/01/14  9:40 PM  Result Value Ref Range Status   Specimen Description URINE, CATHETERIZED  Final   Special Requests NONE  Final   Culture  Setup Time   Final    08/02/2014 14:10 Performed at [facility]    Colony Count NO GROWTH Performed at [facility]   Final   Culture NO GROWTH Performed at [facility]   Final   Report Status 08/03/2014 FINAL  Final  MRSA PCR Screening     Status: None   Collection Time: 08/02/14 12:25 AM  Result Value Ref Range Status   MRSA by PCR NEGATIVE NEGATIVE Final    Comment:        The GeneXpert MRSA Assay (FDA approved for NASAL specimens only), is one component of a comprehensive MRSA colonization surveillance program. It is not intended to diagnose MRSA infection nor to guide or monitor treatment for MRSA infections.     Studies/Results: Dg Chest Port 1 View  08/04/2014   CLINICAL DATA:  Shortness of breath and cough  EXAM: PORTABLE CHEST - 1 VIEW  COMPARISON:  08/03/2014  FINDINGS: The cardiac shadow is stable. The endotracheal tube and nasogastric catheter have been removed in the interval. The lungs are well aerated bilaterally. No focal infiltrate is noted. Mild residual right basilar atelectasis is seen.  IMPRESSION: Mild residual right basilar atelectasis. No new focal abnormality is noted.   Electronically Signed   By: Alcide CleverMark  Lukens M.D.   On: 08/04/2014 08:02      Assessment/Plan:  Active Problems:   Status epilepticus   Acute encephalopathy    Wendy Delgado is a 28 y.o. 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 In talking to her, it seeems that her brother has also been suffering from Neurocysticercosis with parenchymal disease and persistent seizures after anti-helminthic therapy in [place].  Dr Luciana Axeomer has sent serum T Solium antibodies by ELISA (NOTE SERUM AB are more sensitive than CSF ones for dx of Neurocysticercosis)  I have however  also ordered CSF for T solium antibodies by ELISA to [facility]  Per CDC the most sensitive assay is an enzyme-linked immunoelectrotransfer blot on serum but this is only available at [facility]  With regards to treatment the mainstay of treatment for IV Neurocysticercosis is REMOVAL of CYSTS VIA ENDOSCOPIC NEUROSURGERY  When this is done such specimens can be sent to the lab for pathological exam and per my partner Dr. Ephriam Knucklesomer's recs for PCR for T. Solium  With re to anti-helminthic therapy such therapy is  CONTRAINDICATED PRIOR TO NEUROSURGERY  Anti-helminthic drugs can weaken the cyst membrane, causing friability and/or adherence to the ventricular wall.  There are no randomized controlled trials studying effects of anti-helminthic therapy in patients with IV cysticercocis without parenchymal disease but many experts to recommend placing such  patients on anti-helminthic drug (albendazole) AFTER NEUROSURGERY AND  AFTER PREMEDICATION FOR SEVERAL DAYS (typically 3 ) with high dose CORTICOSTEROIDS   Additionally PATIENTS INCLUDING THIS ONE SHOULD HAVE A DEDICATED FUNDOSCOPIC EXAM PERFORMED BY AN OPHTHALMOLOGIST TO RULE OUT INFECTION OF THE EYES WITH T. SOLIUM AS TREATMENT WITH ANTIPARASITICS PRIOR TO REMOVAL FROM THE eyes can result in blindness  I will also check Strongyloides antibody in serum and check Qferon gold as well as HIV antibody and hepatitis panel  I spent greater than 45  minutes with the  patient including greater than 50% of time in face to face counsel of the patient with intrepreter and in coordination of their care.           LOS: 4 days   Acey LavCornelius Van Dam 08/05/2014, 5:41 PM

## 2014-08-06 LAB — BASIC METABOLIC PANEL
ANION GAP: 9 (ref 5–15)
BUN: 7 mg/dL (ref 6–23)
CHLORIDE: 109 meq/L (ref 96–112)
CO2: 24 mmol/L (ref 19–32)
CREATININE: 0.65 mg/dL (ref 0.50–1.10)
Calcium: 9.1 mg/dL (ref 8.4–10.5)
GFR calc non Af Amer: >90 mL/min (ref >90)
Glucose, Bld: 103 mg/dL — ABNORMAL HIGH (ref 70–99)
POTASSIUM: 3.6 mmol/L (ref 3.5–5.1)
Sodium: 142 mmol/L (ref 135–145)

## 2014-08-06 LAB — HIV ANTIBODY (ROUTINE TESTING W REFLEX): HIV 1&2 Ab, 4th Generation: NONREACTIVE

## 2014-08-06 NOTE — Progress Notes (Signed)
Physical Therapy Evaluation Patient Details Name: Wendy Delgado MRN: [id number] DOB: [birth year] Today's Date: 08/06/2014   History of Present Illness  Patient is a 28 y[place] who presented to [facility] on 08/01/14 after being found unresponsive.  She had been complaining of a severe headache and had a generalized seizure in the ED.  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   She has required IVC and considering cyst removal vs shunting.  Intubated 12/22-12/24  Clinical Impression  Patient presents with problems listed below.  Will benefit from acute PT to maximize independence prior to discharge home with family.  Patient lethargic today, impacting functional mobility.  Anticipate mobility will improve as patient becomes more alert.    Follow Up Recommendations No PT follow up;Supervision/Assistance - 24 hour    Equipment Recommendations   (TBD)    Recommendations for Other Services       Precautions / Restrictions Precautions Precautions: Fall Precaution Comments: IVC drain Restrictions Weight Bearing Restrictions: No      Mobility  Bed Mobility Overal bed mobility: Needs Assistance Bed Mobility: Supine to Sit     Supine to sit: Min assist     General bed mobility comments: RN in to disconnect IVC drain. Verbal and visual cues for mobility.  Assist for balance/safety during transition to sitting.  Transfers Overall transfer level: Needs assistance Equipment used: 2 person hand held assist Transfers: Sit to/from [employer];Stand Pivot Transfers Sit to Stand: Min assist;+2 physical assistance Stand pivot transfers: Min assist;+2 safety/equipment       General transfer comment: Verbal and visual cues for transfers.  Assist to move to standing and for balance initially.  Patient able to take  several steps to pivot to chair with min assist and +1 to manage lines/tubes.  Ambulation/Gait                Stairs            Wheelchair Mobility    Modified Rankin (Stroke Patients Only)       Balance Overall balance assessment: Needs assistance         Standing balance support: Single extremity supported Standing balance-Leahy Scale: Poor Standing balance comment: Needed UE support to maintain balance.                             Pertinent Vitals/Pain Pain Assessment: No/denies pain    Home Living Family/patient expects to be discharged to:: Private residence Living Arrangements: Spouse/significant other;Children               Additional Comments: Unable to gather information on living situation from patient.    Prior Function Level of Independence: Independent               Hand Dominance        Extremity/Trunk Assessment   Upper Extremity Assessment: Overall WFL for tasks assessed           Lower Extremity Assessment: Generalized weakness         Communication   Communication: Prefers language other than English (Spanish speaking)  Cognition Arousal/Alertness: Lethargic Behavior During Therapy: Flat affect Overall Cognitive Status: Difficult to assess                      General Comments  Exercises        Assessment/Plan    PT Assessment Patient needs continued PT services  PT Diagnosis Difficulty walking;Generalized weakness   PT Problem List Decreased strength;Decreased activity tolerance;Decreased balance;Decreased mobility;Decreased knowledge of use of DME;Decreased safety awareness  PT Treatment Interventions DME instruction;Gait training;Functional mobility training;Therapeutic activities;Therapeutic exercise;Balance training;Patient/family education   PT Goals (Current goals can be found in the Care Plan section) Acute Rehab PT Goals Patient Stated Goal: None stated PT Goal  Formulation: With patient Time For Goal Achievement: 08/13/14 Potential to Achieve Goals: Good    Frequency Min 3X/week   Barriers to discharge        Co-evaluation               End of Session Equipment Utilized During Treatment: Gait belt Activity Tolerance: Patient limited by lethargy;Patient limited by fatigue Patient left: in chair;with call bell/phone within reach Nurse Communication: Mobility status         Time: [id number] PT Time Calculation (min) (ACUTE ONLY): 11 min   Charges:   PT Evaluation $Initial PT Evaluation Tier I: 1 Procedure PT Treatments $Therapeutic Activity: 8-22 mins   PT G Codes:        Vena AustriaDavis, Rylann Munford H 08/06/2014, 2:14 PM Durenda HurtSusan H. Renaldo Fiddleravis, PT, [facility] Acute Rehab Services Pager [phone]

## 2014-08-06 NOTE — Progress Notes (Addendum)
Subjective: No changes  Exam: Filed Vitals:   08/06/14 0700  BP: 120/44  Pulse: 50  Temp:   Resp: 16   Gen: In bed, NAD MS: awakens easily. follows commands, answers questions though limited due to language barrier.  ZO:XWRUCN:eyes conjugate. Pupils equal this morning.   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 Serum and CSF Antibodies pending. It appears on MRI that there are some other lateral ventrical cysts, though I am not sure if they are viable or not. If the cyst is removed, tissue for PCR would be helpful to confirm diagnosis(though suspicion is very high).  I am not sure at all that she presented with seizures, but suspect tha tshe was rather posturing, though seizures are certainly a possibility. Given the structural cause that has been removed(hydrocephalus), I do not favor anti-epileptic therapy at this time. She does not have any parenchymal disease.   Recommendations: 1) Appreciate neurosurgical assistence.  2) Appreciate ID assistence 3) will need optho eval prior to anti-helminthic therapy as per Dr. Zenaida NieceVan Dam's note.    Ritta SlotMcNeill Jesusa Stenerson, MD [facility] [phone]  If 7pm- 7am, please page neurology on call as listed in AMION.

## 2014-08-06 NOTE — Progress Notes (Signed)
PULMONARY / CRITICAL CARE MEDICINE   Name: Wendy Delgado MRN: [id number] DOB: 07/23/1986    ADMISSION DATBaxter Hire:  08/01/2014 CONSULTATION DATE:  08/06/2014  REFERRING MD :  [facility]  CHIEF COMPLAINT:  Status Epilepticus  INITIAL PRESENTATION:  28 y.o. F taken to [facility] on 12/22 after being found unresponsive by husband. Pt reportedly had severe headache earlier in afternoon.  While in ED, pt reportedly had a seizure which did not break and led to status epilepticus.  [facility]    STUDIES:  CT Head >>> no hemorrhage, moderate hydrocephalus  SIGNIFICANT EVENTS: 12/22 - taken to [facility] after being found unresponsive.  Reportedly had seizures and status, intubated and [facility]   SUBJECTIVE: Awake, afebrile No Headache   VITAL SIGNS: Temp:  [97.6 F (36.4 C)-99.5 F (37.5 C)] 97.6 F (36.4 C) (12/27 0349) Pulse Rate:  [39-95] 50 (12/27 0700) Resp:  [16-26] 16 (12/27 0700) BP: (100-123)/(44-71) 120/44 mmHg (12/27 0700) SpO2:  [92 %-100 %] 99 % (12/27 0700) HEMODYNAMICS:   VENTILATOR SETTINGS:   INTAKE / OUTPUT: Intake/Output      12/26 0701 - 12/27 0700 12/27 0701 - 12/28 0700   P.O. 1080    I.V. (mL/kg)     Other 120    IV Piggyback 200    Total Intake(mL/kg) 1400 (15.2)    Urine (mL/kg/hr) 2500 (1.1)    Drains 278 (0.1)    Total Output 2778     Net -1378           PHYSICAL EXAMINATION: General: Young female, in NAD.Awake andalert Neuro: Follows commands, non focal, speaks spanish HEENT: [place]/AT. Pupils very sluggish, sclerae anicteric. Cardiovascular: RRR, no M/R/G.  Lungs: Respirations even and unlabored.  CTA bilaterally, No W/R/R.  Abdomen: BS x 4, soft, NT/ND.  Musculoskeletal: No gross deformities, no edema. Skin: Intact, warm, no rashes.  LABS:  CBC  Recent Labs Lab 08/02/14 0635 08/03/14 0228 08/04/14 0327  WBC 16.7* 13.1* 10.2  HGB 12.7 12.3 11.5*  HCT 39.0 37.8 35.1*  PLT 277 242 214    Coag's No results for input(s): APTT, INR in the last 168 hours. BMET  Recent Labs Lab 08/03/14 0228 08/04/14 0327 08/06/14 0605  NA 143 140 142  K 3.1* 3.4* 3.6  CL 114* 109 109  CO2 23 22 24   BUN <5* <5* 7  CREATININE 0.53 0.45* 0.65  GLUCOSE 110* 81 103*   Electrolytes  Recent Labs Lab 08/02/14 0635 08/03/14 0228 08/04/14 0327 08/06/14 0605  CALCIUM 8.1* 8.4 8.4 9.1  MG 1.9 2.0  --   --   PHOS 3.0 2.0*  --   --    Sepsis Markers  Recent Labs Lab 08/01/14 2123  LATICACIDVEN 1.5   ABG  Recent Labs Lab 08/02/14 0452 08/03/14 0358  PHART 7.386 7.423  PCO2ART 33.8* 32.5*  PO2ART 141.0* 173.0*   Liver Enzymes  Recent Labs Lab 08/01/14 2108  AST 16  ALT 15  ALKPHOS 79  BILITOT 0.5  ALBUMIN 4.1   Cardiac Enzymes No results for input(s): TROPONINI, PROBNP in the last 168 hours. Glucose No results for input(s): GLUCAP in the last 168 hours.  Imaging No results found.  ASSESSMENT / PLAN:   NEUROLOGIC A:   Acute metabolic encephalopathy(resolved 12/24) New onset seizures Hydrocephalus s/p ventric Neurocysticercosis  P:   RASS goal: 0  Await plan from Neurosurgery &ID -may need eye exam  PT/OT  PULMONARY OETT 12/22 >>>  12/24 A: Acute respiratory failure P:  IS while in bed     GASTROINTESTINAL A:   Nutrition P:  Reg diet Replete K   HEMATOLOGIC A:   VTE Prophylaxis P:  SCD's / Heparin. CBC  INFECTIOUS A:  Neuricysticercosis P:   Await ELISA . Would need steroids & antihelminthic eventually-defer to ID  ENDOCRINE A:   No known issues P:   SSI if glucose consistently > 180.  Family updated: None.  Interdisciplinary Family Meeting v Palliative Care Meeting:  NA  Summary -  Await  Plan - surgery vs medical Rx - stay in ICU until drain [facility],RAKESH V. MD 08/06/2014, 9:27 AM

## 2014-08-06 NOTE — Progress Notes (Signed)
Pt seen and examined. No issues overnight. Pt is sleepy but does not report any significant HA, or visual changes.  EXAM: Temp:  [97.6 F (36.4 C)-99.5 F (37.5 C)] 97.6 F (36.4 C) (12/27 0349) Pulse Rate:  [39-95] 50 (12/27 0700) Resp:  [16-26] 16 (12/27 0700) BP: (100-123)/(44-71) 120/44 mmHg (12/27 0700) SpO2:  [92 %-100 %] 99 % (12/27 0700) Intake/Output      12/26 0701 - 12/27 0700 12/27 0701 - 12/28 0700   P.O. 1080 240   I.V. (mL/kg)     Other 120 20   IV Piggyback 200    Total Intake(mL/kg) 1400 (15.2) 260 (2.8)   Urine (mL/kg/hr) 2500 (1.1) 100 (0.4)   Drains 278 (0.1) 26 (0.1)   Total Output 2778 126   Net -1378 +134         Drowsy but easily arousable Oriented to person, place, year CN grossly intact Moving all extremities well EVD in place, 270cc  LABS: Lab Results  Component Value Date   CREATININE 0.65 08/06/2014   BUN 7 08/06/2014   NA 142 08/06/2014   K 3.6 08/06/2014   CL 109 08/06/2014   CO2 24 08/06/2014   Lab Results  Component Value Date   WBC 10.2 08/04/2014   HGB 11.5* 08/04/2014   HCT 35.1* 08/04/2014   MCV 95.4 08/04/2014   PLT 214 08/04/2014    IMPRESSION: - 28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

## 2014-08-07 LAB — QUANTIFERON TB GOLD ASSAY (BLOOD)
INTERFERON GAMMA RELEASE ASSAY: NEGATIVE
Mitogen value: >10 IU/mL
Quantiferon Nil Value: 0.03 IU/mL
TB AG VALUE: 0.13 [IU]/mL
TB Antigen Minus Nil Value: 0.1 IU/mL

## 2014-08-07 NOTE — Progress Notes (Signed)
Interpreter Wyvonnia DuskyGraciela Namihira for Raynelle Fanningjulie OT and HarrisonMegan PT

## 2014-08-07 NOTE — Progress Notes (Signed)
Speech Language Pathology Treatment: Dysphagia  Patient Details Name: Wendy Delgado MRN: [id number] DOB: 1985/10/18 Today's Date: 08/07/2014 Time: 2979-8921 SLP Time Calculation (min) (ACUTE ONLY): 8 min  Assessment / Plan / Recommendation Clinical Impression  Pt sitting in chair consuming lunch. RN states no difficulty with recommended diet texture or liquids. Interpretation assist via RN and (family- woman) and pt reported no difficulty. No evidence of pna with thin via straw or solids. Continue regular texture and thin liquids. No follow up ST needed.   HPI HPI: 28 [place]o. [place] who presented to [facility] after being found unresponsive. She had been complaining of a severe headache and had a generalized seizure in the ED. 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   She has required IVC and considering cyst removal vs shunting. Intubated 12/22-12/24   Pertinent Vitals Pain Assessment: No/denies pain (Simultaneous filing. User may not have seen previous data.)  SLP Plan  All goals met;Discharge SLP treatment due to (comment)    Recommendations Diet recommendations: Regular;Thin liquid Liquids provided via: Straw;Cup Medication Administration: Whole meds with liquid Supervision: Patient able to self feed Compensations: Small sips/bites Postural Changes and/or Swallow Maneuvers: Seated upright 90 degrees              Oral Care Recommendations: Oral care BID Follow up Recommendations: None Plan: All goals met;Discharge SLP treatment due to (comment)    GO     Houston Siren 08/07/2014, 2:37 PM  Orbie Pyo Colvin Caroli.Ed [employer] [phone]

## 2014-08-07 NOTE — Evaluation (Signed)
Occupational Therapy Evaluation Patient Details Name: Wendy Delgado MRN: [id number] DOB: 02/21/1986 Today's Date: 08/07/2014    History of Present Illness Patient is a 28 [place] who presented to [facility] on 08/01/14 after being found unresponsive.  She had been complaining of a severe headache and had a generalized seizure in the ED.  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   She has required IVC and considering cyst removal vs shunting.  Intubated 12/22-12/24   Clinical Impression   Pt was independent prior to admission.  Pt presents with lethargy and dizziness, generalized weakness, and impaired balance interfering with ability to perform ADL and ADL transfers.  Unclear of pt's cognitive status due to language barrier, but demonstrated some difficulty with awareness of situation.  Will follow acutely and evaluate/update discharge disposition.    Follow Up Recommendations  Supervision/Assistance - 24 hour (to be determined)    Equipment Recommendations       Recommendations for Other Services       Precautions / Restrictions Precautions Precautions: Fall Precaution Comments: IVC drain Restrictions Weight Bearing Restrictions: No      Mobility Bed Mobility   Bed Mobility: Supine to Sit     Supine to sit: Min assist     General bed mobility comments: reaches for assist  Transfers Overall transfer level: Needs assistance Equipment used: 1 person hand held assist Transfers: Sit to/from [employer] CorporationStand;Stand Pivot Transfers Sit to Stand: Min assist Stand pivot transfers: Min assist;+2 physical assistance;+2 safety/equipment       General transfer comment: verbal and visual cues to transfer    Balance Overall balance assessment: Needs assistance   Sitting balance-Leahy Scale: Fair        Standing balance-Leahy Scale: Poor                              ADL Overall ADL's : Needs assistance/impaired Eating/Feeding: Set up;Sitting   Grooming: Wash/dry hands;Wash/dry face;Supervision/safety;Sitting   Upper Body Bathing: Minimal assitance;Sitting   Lower Body Bathing: Minimal assistance;Sit to/from stand   Upper Body Dressing : Supervision/safety;Sitting   Lower Body Dressing: Minimal assistance;Sit to/from stand Lower Body Dressing Details (indicate cue type and reason): able to donn socks at EOB with supervision for safety Toilet Transfer: Minimal assistance;Stand-pivot Toilet Transfer Details (indicate cue type and reason): simulated bed to chair Toileting- Clothing Manipulation and Hygiene: Minimal assistance;Sit to/from stand         General ADL Comments: Pt with dizziness and fatigue.     Vision                 Additional Comments: no visual changes   Perception     Praxis      Pertinent Vitals/Pain Pain Assessment: No/denies pain (Simultaneous filing. User may not have seen previous data.)     Hand Dominance Right   Extremity/Trunk Assessment Upper Extremity Assessment Upper Extremity Assessment: Generalized weakness   Lower Extremity Assessment Lower Extremity Assessment: Defer to PT evaluation   Cervical / Trunk Assessment Cervical / Trunk Assessment: Normal   Communication Communication Communication: Prefers language other than English (Spanish)   Cognition Arousal/Alertness: Lethargic Behavior During Therapy: Flat affect Overall Cognitive Status: Difficult to assess                     General Comments  Exercises       Shoulder Instructions      Home Living Family/patient expects to be discharged to:: Private residence Living Arrangements: Spouse/significant other;Children Available Help at Discharge: Family;Available 24 hours/day Type of Home: House Home Access: Stairs to enter ITT IndustriesEntrance  Stairs-Number of Steps: 3 Entrance Stairs-Rails: Right;Left Home Layout: One level     Bathroom Shower/Tub: Tub/shower unit;Walk-in shower   Bathroom Toilet: Standard     Home Equipment: None   Additional Comments: PLOF and hx via husband through interpreter      Prior Functioning/Environment Level of Independence: Independent             OT Diagnosis: Generalized weakness;Cognitive deficits   OT Problem List: Decreased strength;Decreased activity tolerance;Impaired balance (sitting and/or standing);Decreased cognition;Decreased knowledge of use of DME or AE   OT Treatment/Interventions: Self-care/ADL training;DME and/or AE instruction;Therapeutic activities;Patient/family education;Balance training;Cognitive remediation/compensation    OT Goals(Current goals can be found in the care plan section) Acute Rehab OT Goals Patient Stated Goal: return home with family OT Goal Formulation: With patient Time For Goal Achievement: 08/21/14 Potential to Achieve Goals: Good ADL Goals Pt Will Perform Grooming: with supervision;standing Pt Will Perform Lower Body Bathing: with supervision;sit to/from stand Pt Will Perform Lower Body Dressing: with supervision;sit to/from stand Pt Will Transfer to Toilet: with supervision;ambulating;regular height toilet Pt Will Perform Toileting - Clothing Manipulation and hygiene: with supervision;sit to/from stand Pt Will Perform Tub/Shower Transfer: Shower transfer;with supervision;ambulating Additional ADL Goal #1: Pt will gather items necessary for ADL with supervision.  OT Frequency: Min 2X/week   Barriers to D/C:            Co-evaluation PT/OT/SLP Co-Evaluation/Treatment: Yes Reason for Co-Treatment: Complexity of the patient's impairments (multi-system involvement);For patient/therapist safety   OT goals addressed during session: ADL's and self-care      End of Session Nurse Communication:  (burning around catheter,  dizziness)  Activity Tolerance: Patient limited by fatigue;Other (comment) (dizziness) Patient left: in chair;with call bell/phone within reach;with family/visitor present;with nursing/sitter in room   Time: [phone] OT Time Calculation (min): 27 min Charges:  OT General Charges $OT Visit: 1 Procedure OT Evaluation $Initial OT Evaluation Tier I: 1 Procedure OT Treatments $Self Care/Home Management : 8-22 mins G-Codes:    Evern BioMayberry, Samule Life Lynn 08/07/2014, 2:41 PM  [phone]

## 2014-08-07 NOTE — Progress Notes (Signed)
PULMONARY / CRITICAL CARE MEDICINE   Name: Wendy Delgado MRN: [id number] DOB: 17-Feb-1986    ADMISSION DATE:  08/01/2014 CONSULTATION DATE:  08/07/2014  REFERRING MD :  OSH  CHIEF COMPLAINT:  Status Epilepticus  INITIAL PRESENTATION:  28 y.o. F taken to [facility] on 12/22 after being found unresponsive by husband. Pt reportedly had severe headache earlier in afternoon.  While in ED, pt reportedly had a seizure which did not break and led to status epilepticus.  [facility]    STUDIES:  CT Head >>> no hemorrhage, moderate hydrocephalus  SIGNIFICANT EVENTS: 12/22 - taken to [facility] after being found unresponsive.  Reportedly had seizures and status, [facility]ub[facility]   SUBJECTIVE: Awake, afebrile mild Headache   VITAL SIGNS: Temp:  [97.7 F (36.5 C)-99.5 F (37.5 C)] 98.5 F (36.9 C) (12/28 0805) Pulse Rate:  [45-84] 59 (12/28 0900) Resp:  [14-23] 15 (12/28 0900) BP: (92-117)/(46-72) 100/52 mmHg (12/28 0900) SpO2:  [96 %-100 %] 99 % (12/28 0900) HEMODYNAMICS:   VENTILATOR SETTINGS:   INTAKE / OUTPUT: Intake/Output      12/27 0701 - 12/28 0700 12/28 0701 - 12/29 0700   P.O. 840    Other 140    IV Piggyback     Total Intake(mL/kg) 980 (10.7)    Urine (mL/kg/hr) 800 (0.4)    Drains 285 (0.1) 29 (0.1)   Total Output 1085 29   Net -105 -29         PHYSICAL EXAMINATION: General: Young female, in NAD.Awake andalert Neuro: Follows commands, non focal, speaks spanish HEENT: [place]/AT. Pupils very sluggish, sclerae anicteric. Cardiovascular: RRR, no M/R/G.  Lungs: Respirations even and unlabored.  CTA bilaterally, No W/R/R.  Abdomen: BS x 4, soft, NT/ND.  Musculoskeletal: No gross deformities, no edema. Skin: Intact, warm, no rashes.  LABS:  CBC  Recent Labs Lab 08/02/14 0635 08/03/14 0228 08/04/14 0327  WBC 16.7* 13.1* 10.2  HGB 12.7 12.3 11.5*  HCT 39.0 37.8 35.1*  PLT 277 242 214    Coag's No results for input(s): APTT, INR in the last 168 hours. BMET  Recent Labs Lab 08/03/14 0228 08/04/14 0327 08/06/14 0605  NA 143 140 142  K 3.1* 3.4* 3.6  CL 114* 109 109  CO2 23 22 24   BUN <5* <5* 7  CREATININE 0.53 0.45* 0.65  GLUCOSE 110* 81 103*   Electrolytes  Recent Labs Lab 08/02/14 0635 08/03/14 0228 08/04/14 0327 08/06/14 0605  CALCIUM 8.1* 8.4 8.4 9.1  MG 1.9 2.0  --   --   PHOS 3.0 2.0*  --   --    Sepsis Markers  Recent Labs Lab 08/01/14 2123  LATICACIDVEN 1.5   ABG  Recent Labs Lab 08/02/14 0452 08/03/14 0358  PHART 7.386 7.423  PCO2ART 33.8* 32.5*  PO2ART 141.0* 173.0*   Liver Enzymes  Recent Labs Lab 08/01/14 2108  AST 16  ALT 15  ALKPHOS 79  BILITOT 0.5  ALBUMIN 4.1   Cardiac Enzymes No results for input(s): TROPONINI, PROBNP in the last 168 hours. Glucose No results for input(s): GLUCAP in the last 168 hours.  Imaging No results found.  ASSESSMENT / PLAN:   NEUROLOGIC A:   Acute metabolic encephalopathy(resolved 12/24) New onset seizures Hydrocephalus s/p ventric Neurocysticercosis  P:   RASS goal: 0  Await plan from Neurosurgery &ID -may need eye exam  PT/OT  PULMONARY OETT 12/22 >>>12/24 A: Acute respiratory failure P:  IS while in bed   GASTROINTESTINAL A:   Nutrition P:  Reg diet Replete K   HEMATOLOGIC A:   VTE Prophylaxis P:  SCD's / Heparin. CBC  INFECTIOUS A:  Neuricysticercosis P:   Await ELISA . Would need steroids & antihelminthic eventually-defer to ID  ENDOCRINE A:   No known issues P:   SSI if glucose consistently > 180.  Family updated: None.  Interdisciplinary Family Meeting v Palliative Care Meeting:  NA  Summary -  Await  Plan - surgery vs medical Rx - stay in ICU until drain Use spanish interpreter to explain plan & disease condition to pt & family  Oretha MilchALVA,Seaver Machia V. MD 08/07/2014, 10:07 AM

## 2014-08-07 NOTE — Progress Notes (Signed)
Patient ID: Wendy Delgado, female   DOB: [birth year], 28 y.o.   MRN: [id number] Vital signs are stable. Patient is awake alert IVC continues to drain steadily I have discussed and endoscopic surgery with Dr. Conchita ParisNundKumar. He has concerns because of the extremely posterior nature and the third ventricle that this lesion may not be easy to access with a ventriculoscope.  For the time being I believe that treating hydrocephalus is the most appropriate measure.  I have discussed the situation with the patient and her husband and recommended ventriculoperitoneal shunting via right parietal approach. I discussed the shunt procedure through an interpreter answered her questions appropriately. We'll plan on scheduling this for the morning.

## 2014-08-07 NOTE — Progress Notes (Signed)
Physical Therapy Treatment Patient Details Name: Wendy Delgado MRN: [id number] DOB: 1985-10-31 Today's Date: 08/07/2014    History of Present Illness Patient is a 28 [place] who presented to [facility] on 08/01/14 after being found unresponsive.  She had been complaining of a severe headache and had a generalized seizure in the ED.  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   She has required IVC and considering cyst removal vs shunting.  Intubated 12/22-12/24    PT Comments    Pt very fatigued today, but agreeable to participate in mobility.  Pt indicating dizziness during mobility that is better once sitting in recliner.  BP monitored.  Utilized interpretor to A with communication and pt oriented x4 and answering questions appropriately with interpretation.  Feel pt will make good progress and likely not need any Delgado/u.  Will continue to follow.    Follow Up Recommendations  No PT follow up;Supervision/Assistance - 24 hour     Equipment Recommendations   (TBD)    Recommendations for Other Services       Precautions / Restrictions Precautions Precautions: Fall Precaution Comments: IVC drain clamped by RN prior to treatment Restrictions Weight Bearing Restrictions: No    Mobility  Bed Mobility Overal bed mobility: Needs Assistance Bed Mobility: Supine to Sit     Supine to sit: Min assist     General bed mobility comments: reaches for assist  Transfers Overall transfer level: Needs assistance Equipment used: 1 person hand held assist Transfers: Sit to/from Stand;Stand Pivot Transfers Sit to Stand: Min assist Stand pivot transfers: Min assist;+2 physical assistance;+2 safety/equipment       General transfer comment: pt indicates dizziness in standing and very fatigued.  cueing for safe  technique and 2nd person present to manage lines.    Ambulation/Gait                 Stairs            Wheelchair Mobility    Modified Rankin (Stroke Patients Only)       Balance Overall balance assessment: Needs assistance Sitting-balance support: No upper extremity supported;Feet supported Sitting balance-Leahy Scale: Fair     Standing balance support: No upper extremity supported;During functional activity Standing balance-Leahy Scale: Poor                      Cognition Arousal/Alertness: Lethargic Behavior During Therapy: Flat affect Overall Cognitive Status: Within Functional Limits for tasks assessed                      Exercises      General Comments        Pertinent Vitals/Pain Pain Assessment: No/denies pain    Home Living Family/patient expects to be discharged to:: Private residence Living Arrangements: Spouse/significant other;Children Available Help at Discharge: Family;Available 24 hours/day Type of Home: House Home Access: Stairs to enter Entrance Stairs-Rails: Right;Left Home Layout: One level Home Equipment: None Additional Comments: PLOF and hx via husband through interpreter    Prior Function Level of Independence: Independent          PT Goals (current goals can now be found in the care plan section) Acute Rehab PT Goals Patient Stated Goal: return home with family PT Goal Formulation: With patient Time For Goal Achievement: 08/13/14 Potential to Achieve Goals: Good Progress towards PT  goals: Progressing toward goals    Frequency  Min 3X/week    PT Plan Current plan remains appropriate    Co-evaluation PT/OT/SLP Co-Evaluation/Treatment: Yes Reason for Co-Treatment: Complexity of the patient's impairments (multi-system involvement);Other (comment) (Interpretor present) PT goals addressed during session: Mobility/safety with mobility;Balance OT goals addressed during session: ADL's and  self-care     End of Session Equipment Utilized During Treatment: Gait belt Activity Tolerance: Patient limited by fatigue Patient left: in chair;with call bell/phone within reach;with family/visitor present     Time: [id number] PT Time Calculation (min) (ACUTE ONLY): 27 min  Charges:  $Therapeutic Activity: 8-22 mins                    G CodesSunny Delgado:      Wendy Delgado, South CarolinaPT [phone] 08/07/2014, 2:55 PM

## 2014-08-08 ENCOUNTER — Inpatient Hospital Stay (HOSPITAL_COMMUNITY): Payer: Medicaid Other | Admitting: Certified Registered Nurse Anesthetist

## 2014-08-08 ENCOUNTER — Encounter (HOSPITAL_COMMUNITY)
Admission: EM | Disposition: A | Discharge status: Other Acute Inpatient Hospital | Payer: Self-pay | Source: Home / Self Care | Attending: Pulmonary Disease

## 2014-08-08 ENCOUNTER — Inpatient Hospital Stay (HOSPITAL_COMMUNITY): Payer: Medicaid Other

## 2014-08-08 ENCOUNTER — Encounter (HOSPITAL_COMMUNITY): Payer: Self-pay | Admitting: Certified Registered Nurse Anesthetist

## 2014-08-08 ENCOUNTER — Inpatient Hospital Stay (HOSPITAL_COMMUNITY): Payer: Medicaid Other | Admitting: Certified Registered"

## 2014-08-08 DIAGNOSIS — J9600 Acute respiratory failure, unspecified whether with hypoxia or hypercapnia: Secondary | ICD-10-CM | POA: Diagnosis not present

## 2014-08-08 DIAGNOSIS — G9341 Metabolic encephalopathy: Secondary | ICD-10-CM | POA: Diagnosis not present

## 2014-08-08 DIAGNOSIS — G911 Obstructive hydrocephalus: Secondary | ICD-10-CM | POA: Diagnosis not present

## 2014-08-08 DIAGNOSIS — R4020 Unspecified coma: Secondary | ICD-10-CM | POA: Diagnosis not present

## 2014-08-08 HISTORY — PX: SHUNT REVISION VENTRICULAR-PERITONEAL: SHX6094

## 2014-08-08 HISTORY — PX: VENTRICULOPERITONEAL SHUNT: SHX204

## 2014-08-08 LAB — CYSTICERCOSIS AB, IGG BY ELISA

## 2014-08-08 SURGERY — REVISION, SHUNT, VENTRICULOPERITONEAL
Anesthesia: General

## 2014-08-08 SURGERY — SHUNT INSERTION VENTRICULAR-PERITONEAL
Anesthesia: General | Laterality: Right

## 2014-08-08 MED ORDER — ONDANSETRON HCL 4 MG/2ML IJ SOLN: 4.0000 mg | Freq: Once | INTRAMUSCULAR | Status: DC | PRN

## 2014-08-08 MED ORDER — GLYCOPYRROLATE 0.2 MG/ML IJ SOLN
INTRAMUSCULAR | Status: AC
  Filled 2014-08-08: qty 3

## 2014-08-08 MED ORDER — PROPOFOL 10 MG/ML IV BOLUS
INTRAVENOUS | Status: AC
  Filled 2014-08-08: qty 20

## 2014-08-08 MED ORDER — PANTOPRAZOLE SODIUM 40 MG IV SOLR
40.0000 mg | Freq: Every day | INTRAVENOUS | Status: DC
  Administered 2014-08-08: 40 mg via INTRAVENOUS
  Filled 2014-08-08 (×2): qty 40

## 2014-08-08 MED ORDER — PANTOPRAZOLE SODIUM 40 MG IV SOLR: 40.0000 mg | Freq: Every day | INTRAVENOUS | Status: DC

## 2014-08-08 MED ORDER — CEFAZOLIN SODIUM 1-5 GM-% IV SOLN
1.0000 g | Freq: Three times a day (TID) | INTRAVENOUS | Status: AC
  Administered 2014-08-09 (×2): 1 g via INTRAVENOUS
  Filled 2014-08-08 (×2): qty 50

## 2014-08-08 MED ORDER — PROPOFOL 10 MG/ML IV BOLUS
INTRAVENOUS | Status: DC | PRN
  Administered 2014-08-08 (×2): 30 mg via INTRAVENOUS
  Administered 2014-08-08: 20 mg via INTRAVENOUS
  Administered 2014-08-08: 100 mg via INTRAVENOUS

## 2014-08-08 MED ORDER — ARTIFICIAL TEARS OP OINT
TOPICAL_OINTMENT | OPHTHALMIC | Status: AC
  Filled 2014-08-08: qty 3.5

## 2014-08-08 MED ORDER — THROMBIN 5000 UNITS EX SOLR
CUTANEOUS | Status: DC | PRN
  Administered 2014-08-08 (×2): 5000 [IU] via TOPICAL

## 2014-08-08 MED ORDER — SUCCINYLCHOLINE CHLORIDE 20 MG/ML IJ SOLN
INTRAMUSCULAR | Status: DC | PRN
  Administered 2014-08-08: 70 mg via INTRAVENOUS

## 2014-08-08 MED ORDER — HEMOSTATIC AGENTS (NO CHARGE) OPTIME
TOPICAL | Status: DC | PRN
  Administered 2014-08-08: 1 via TOPICAL

## 2014-08-08 MED ORDER — ONDANSETRON HCL 4 MG/2ML IJ SOLN: 4.0000 mg | INTRAMUSCULAR | Status: DC | PRN

## 2014-08-08 MED ORDER — NEOSTIGMINE METHYLSULFATE 10 MG/10ML IV SOLN
INTRAVENOUS | Status: DC | PRN
  Administered 2014-08-08: 4 mg via INTRAVENOUS

## 2014-08-08 MED ORDER — LIDOCAINE HCL (CARDIAC) 20 MG/ML IV SOLN
INTRAVENOUS | Status: DC | PRN
  Administered 2014-08-08: 70 mg via INTRAVENOUS

## 2014-08-08 MED ORDER — FENTANYL CITRATE 0.05 MG/ML IJ SOLN
25.0000 ug | INTRAMUSCULAR | Status: DC | PRN
  Administered 2014-08-08: 50 ug via INTRAVENOUS

## 2014-08-08 MED ORDER — ROCURONIUM BROMIDE 50 MG/5ML IV SOLN
INTRAVENOUS | Status: AC
  Filled 2014-08-08: qty 1

## 2014-08-08 MED ORDER — PROMETHAZINE HCL 25 MG PO TABS: 12.5000 mg | ORAL_TABLET | ORAL | Status: DC | PRN

## 2014-08-08 MED ORDER — LEVETIRACETAM IN NACL 500 MG/100ML IV SOLN
500.0000 mg | Freq: Two times a day (BID) | INTRAVENOUS | Status: DC
  Administered 2014-08-08: 500 mg via INTRAVENOUS
  Filled 2014-08-08 (×2): qty 100

## 2014-08-08 MED ORDER — CEFAZOLIN SODIUM-DEXTROSE 2-3 GM-% IV SOLR
INTRAVENOUS | Status: AC
  Filled 2014-08-08: qty 50

## 2014-08-08 MED ORDER — BUPIVACAINE HCL (PF) 0.25 % IJ SOLN
INTRAMUSCULAR | Status: DC | PRN
  Administered 2014-08-08: 10 mL

## 2014-08-08 MED ORDER — FENTANYL CITRATE 0.05 MG/ML IJ SOLN
INTRAMUSCULAR | Status: AC
  Filled 2014-08-08: qty 2

## 2014-08-08 MED ORDER — LIDOCAINE HCL (CARDIAC) 20 MG/ML IV SOLN
INTRAVENOUS | Status: DC | PRN
  Administered 2014-08-08: 60 mg via INTRAVENOUS

## 2014-08-08 MED ORDER — FENTANYL CITRATE 0.05 MG/ML IJ SOLN
INTRAMUSCULAR | Status: AC
  Filled 2014-08-08: qty 5

## 2014-08-08 MED ORDER — LIDOCAINE-EPINEPHRINE 1 %-1:100000 IJ SOLN
INTRAMUSCULAR | Status: DC | PRN
  Administered 2014-08-08: 10 mL

## 2014-08-08 MED ORDER — PROPOFOL 10 MG/ML IV BOLUS
INTRAVENOUS | Status: DC | PRN
  Administered 2014-08-08: 140 mg via INTRAVENOUS
  Administered 2014-08-08: 50 mg via INTRAVENOUS
  Administered 2014-08-08: 30 mg via INTRAVENOUS

## 2014-08-08 MED ORDER — LIDOCAINE HCL (CARDIAC) 20 MG/ML IV SOLN
INTRAVENOUS | Status: AC
  Filled 2014-08-08: qty 5

## 2014-08-08 MED ORDER — ONDANSETRON HCL 4 MG PO TABS: 4.0000 mg | ORAL_TABLET | ORAL | Status: DC | PRN

## 2014-08-08 MED ORDER — CEFAZOLIN SODIUM-DEXTROSE 2-3 GM-% IV SOLR
INTRAVENOUS | Status: DC | PRN
  Administered 2014-08-08: 2 g via INTRAVENOUS

## 2014-08-08 MED ORDER — SUCCINYLCHOLINE CHLORIDE 20 MG/ML IJ SOLN
INTRAMUSCULAR | Status: AC
  Filled 2014-08-08: qty 1

## 2014-08-08 MED ORDER — LABETALOL HCL 5 MG/ML IV SOLN: 10.0000 mg | INTRAVENOUS | Status: DC | PRN

## 2014-08-08 MED ORDER — ONDANSETRON HCL 4 MG/2ML IJ SOLN
INTRAMUSCULAR | Status: AC
  Filled 2014-08-08: qty 2

## 2014-08-08 MED ORDER — LACTATED RINGERS IV SOLN
INTRAVENOUS | Status: DC | PRN
  Administered 2014-08-08 (×2): via INTRAVENOUS

## 2014-08-08 MED ORDER — MORPHINE SULFATE 2 MG/ML IJ SOLN: 1.0000 mg | INTRAMUSCULAR | Status: DC | PRN

## 2014-08-08 MED ORDER — DEXAMETHASONE SODIUM PHOSPHATE 4 MG/ML IJ SOLN
INTRAMUSCULAR | Status: AC
  Filled 2014-08-08: qty 1

## 2014-08-08 MED ORDER — HYDROCODONE-ACETAMINOPHEN 5-325 MG PO TABS
1.0000 | ORAL_TABLET | ORAL | Status: DC | PRN
  Administered 2014-08-09 – 2014-08-10 (×3): 1 via ORAL
  Filled 2014-08-08 (×3): qty 1

## 2014-08-08 MED ORDER — GLYCOPYRROLATE 0.2 MG/ML IJ SOLN
INTRAMUSCULAR | Status: DC | PRN
  Administered 2014-08-08: 0.6 mg via INTRAVENOUS

## 2014-08-08 MED ORDER — BACITRACIN 50000 UNITS IM SOLR
INTRAMUSCULAR | Status: DC | PRN
  Administered 2014-08-08: 09:00:00

## 2014-08-08 MED ORDER — 0.9 % SODIUM CHLORIDE (POUR BTL) OPTIME
TOPICAL | Status: DC | PRN
  Administered 2014-08-08: 1000 mL

## 2014-08-08 MED ORDER — LACTATED RINGERS IV SOLN
INTRAVENOUS | Status: DC | PRN
  Administered 2014-08-08: 16:00:00 via INTRAVENOUS

## 2014-08-08 MED ORDER — ONDANSETRON HCL 4 MG/2ML IJ SOLN
INTRAMUSCULAR | Status: DC | PRN
  Administered 2014-08-08: 4 mg via INTRAVENOUS

## 2014-08-08 MED ORDER — ARTIFICIAL TEARS OP OINT
TOPICAL_OINTMENT | OPHTHALMIC | Status: DC | PRN
  Administered 2014-08-08: 1 via OPHTHALMIC

## 2014-08-08 MED ORDER — SODIUM CHLORIDE 0.9 % IR SOLN
Status: DC | PRN
  Administered 2014-08-08: 17:00:00

## 2014-08-08 MED ORDER — PHENYLEPHRINE HCL 10 MG/ML IJ SOLN
INTRAMUSCULAR | Status: DC | PRN
  Administered 2014-08-08 (×2): 40 ug via INTRAVENOUS
  Administered 2014-08-08 (×3): 80 ug via INTRAVENOUS

## 2014-08-08 MED ORDER — ONDANSETRON HCL 4 MG/2ML IJ SOLN
INTRAMUSCULAR | Status: DC | PRN
Start: 2014-08-08 — End: 2014-08-08
  Administered 2014-08-08: 4 mg via INTRAVENOUS

## 2014-08-08 MED ORDER — NEOSTIGMINE METHYLSULFATE 10 MG/10ML IV SOLN
INTRAVENOUS | Status: AC
  Filled 2014-08-08: qty 1

## 2014-08-08 MED ORDER — FENTANYL CITRATE 0.05 MG/ML IJ SOLN
INTRAMUSCULAR | Status: DC | PRN
  Administered 2014-08-08 (×2): 25 ug via INTRAVENOUS
  Administered 2014-08-08: 100 ug via INTRAVENOUS

## 2014-08-08 MED ORDER — PHENYLEPHRINE 40 MCG/ML (10ML) SYRINGE FOR IV PUSH (FOR BLOOD PRESSURE SUPPORT)
PREFILLED_SYRINGE | INTRAVENOUS | Status: AC
  Filled 2014-08-08: qty 10

## 2014-08-08 MED ORDER — ROCURONIUM BROMIDE 100 MG/10ML IV SOLN
INTRAVENOUS | Status: DC | PRN
  Administered 2014-08-08: 10 mg via INTRAVENOUS
  Administered 2014-08-08: 40 mg via INTRAVENOUS

## 2014-08-08 MED ORDER — DEXAMETHASONE SODIUM PHOSPHATE 4 MG/ML IJ SOLN
INTRAMUSCULAR | Status: DC | PRN
  Administered 2014-08-08: 4 mg via INTRAVENOUS

## 2014-08-08 SURGICAL SUPPLY — 70 items
BAG DECANTER FOR FLEXI CONT (MISCELLANEOUS) ×3 IMPLANT
BANDAGE GAUZE 4  KLING STR (GAUZE/BANDAGES/DRESSINGS) IMPLANT
BLADE CLIPPER SURG (BLADE) ×3 IMPLANT
BNDG GAUZE ELAST 4 BULKY (GAUZE/BANDAGES/DRESSINGS) ×3 IMPLANT
BOOT SUTURE AID YELLOW STND (SUTURE) ×3 IMPLANT
BRUSH SCRUB EZ 1% IODOPHOR (MISCELLANEOUS) ×3 IMPLANT
BUR ACORN 6.0 ACORN (BURR) ×2 IMPLANT
BUR ACORN 6.0MM ACORN (BURR) ×1
BUR MATCHSTICK NEURO 3.0 LAGG (BURR) IMPLANT
CANISTER SUCT 3000ML (MISCELLANEOUS) ×3 IMPLANT
CATH PERITONEAL HAKIM 120 (Orthopedic Implant) ×3 IMPLANT
CLIP RANEY DISP (INSTRUMENTS) IMPLANT
CLOSURE WOUND 1/4X4 (GAUZE/BANDAGES/DRESSINGS)
CONT SPEC 4OZ CLIKSEAL STRL BL (MISCELLANEOUS) IMPLANT
DECANTER SPIKE VIAL GLASS SM (MISCELLANEOUS) ×3 IMPLANT
DRAPE INCISE IOBAN 85X60 (DRAPES) ×3 IMPLANT
DRAPE ORTHO SPLIT 77X108 STRL (DRAPES) ×4
DRAPE POUCH INSTRU U-SHP 10X18 (DRAPES) ×3 IMPLANT
DRAPE SURG ORHT 6 SPLT 77X108 (DRAPES) ×2 IMPLANT
DRSG OPSITE POSTOP 3X4 (GAUZE/BANDAGES/DRESSINGS) ×3 IMPLANT
DURAPREP 26ML APPLICATOR (WOUND CARE) ×6 IMPLANT
ELECT REM PT RETURN 9FT ADLT (ELECTROSURGICAL) ×3
ELECTRODE REM PT RTRN 9FT ADLT (ELECTROSURGICAL) ×1 IMPLANT
GAUZE SPONGE 4X4 16PLY XRAY LF (GAUZE/BANDAGES/DRESSINGS) IMPLANT
GLOVE BIO SURGEON STRL SZ7.5 (GLOVE) IMPLANT
GLOVE BIOGEL PI IND STRL 7.5 (GLOVE) IMPLANT
GLOVE BIOGEL PI IND STRL 8 (GLOVE) ×2 IMPLANT
GLOVE BIOGEL PI IND STRL 8.5 (GLOVE) ×1 IMPLANT
GLOVE BIOGEL PI INDICATOR 7.5 (GLOVE)
GLOVE BIOGEL PI INDICATOR 8 (GLOVE) ×4
GLOVE BIOGEL PI INDICATOR 8.5 (GLOVE) ×2
GLOVE ECLIPSE 7.5 STRL STRAW (GLOVE) ×12 IMPLANT
GLOVE ECLIPSE 8.5 STRL (GLOVE) ×6 IMPLANT
GLOVE EXAM NITRILE LRG STRL (GLOVE) IMPLANT
GLOVE EXAM NITRILE MD LF STRL (GLOVE) IMPLANT
GLOVE EXAM NITRILE XL STR (GLOVE) IMPLANT
GLOVE EXAM NITRILE XS STR PU (GLOVE) IMPLANT
GOWN STRL REUS W/ TWL LRG LVL3 (GOWN DISPOSABLE) IMPLANT
GOWN STRL REUS W/ TWL XL LVL3 (GOWN DISPOSABLE) ×1 IMPLANT
GOWN STRL REUS W/TWL 2XL LVL3 (GOWN DISPOSABLE) ×9 IMPLANT
GOWN STRL REUS W/TWL LRG LVL3 (GOWN DISPOSABLE)
GOWN STRL REUS W/TWL XL LVL3 (GOWN DISPOSABLE) ×2
HEMOSTAT SURGICEL 2X14 (HEMOSTASIS) ×3 IMPLANT
KIT BASIN OR (CUSTOM PROCEDURE TRAY) ×3 IMPLANT
KIT ROOM TURNOVER OR (KITS) ×3 IMPLANT
LIQUID BAND (GAUZE/BANDAGES/DRESSINGS) ×3 IMPLANT
NEEDLE HYPO 25X1 1.5 SAFETY (NEEDLE) ×3 IMPLANT
NS IRRIG 1000ML POUR BTL (IV SOLUTION) ×3 IMPLANT
PACK LAMINECTOMY NEURO (CUSTOM PROCEDURE TRAY) ×3 IMPLANT
PAD ARMBOARD 7.5X6 YLW CONV (MISCELLANEOUS) ×9 IMPLANT
SHEATH INTRODUCER 61CM ×3 IMPLANT
SHEATH PERITONEAL INTRO 46 (MISCELLANEOUS) IMPLANT
SHEATH PERITONEAL INTRO 61 (MISCELLANEOUS) ×2 IMPLANT
SPONGE LAP 4X18 X RAY DECT (DISPOSABLE) ×3 IMPLANT
SPONGE SURGIFOAM ABS GEL SZ50 (HEMOSTASIS) ×3 IMPLANT
STAPLER SKIN PROX WIDE 3.9 (STAPLE) ×3 IMPLANT
STRIP CLOSURE SKIN 1/4X4 (GAUZE/BANDAGES/DRESSINGS) IMPLANT
SUT ETHILON 3 0 PS 1 (SUTURE) ×6 IMPLANT
SUT NURALON 4 0 TR CR/8 (SUTURE) ×3 IMPLANT
SUT SILK 0 TIES 10X30 (SUTURE) ×3 IMPLANT
SUT SILK 1 TIES 10X30 (SUTURE) IMPLANT
SUT SILK 3 0 SH 30 (SUTURE) IMPLANT
SUT VIC AB 2-0 CP2 18 (SUTURE) ×6 IMPLANT
SUT VIC AB 3-0 SH 8-18 (SUTURE) ×6 IMPLANT
TOWEL OR 17X24 6PK STRL BLUE (TOWEL DISPOSABLE) ×3 IMPLANT
TOWEL OR 17X26 10 PK STRL BLUE (TOWEL DISPOSABLE) ×3 IMPLANT
TRAY FOLEY CATH 14FRSI W/METER (CATHETERS) IMPLANT
UNDERPAD 30X30 INCONTINENT (UNDERPADS AND DIAPERS) ×3 IMPLANT
VALVE PROGRAM W DISTAL CATH (Prosthesis & Implant Heart) ×3 IMPLANT
WATER STERILE IRR 1000ML POUR (IV SOLUTION) ×3 IMPLANT

## 2014-08-08 SURGICAL SUPPLY — 66 items
BAG DECANTER FOR FLEXI CONT (MISCELLANEOUS) ×3 IMPLANT
BANDAGE GAUZE 4  KLING STR (GAUZE/BANDAGES/DRESSINGS) IMPLANT
BLADE CLIPPER SURG (BLADE) ×6 IMPLANT
BNDG GAUZE ELAST 4 BULKY (GAUZE/BANDAGES/DRESSINGS) IMPLANT
BOOT SUTURE AID YELLOW STND (SUTURE) IMPLANT
BRUSH SCRUB EZ 1% IODOPHOR (MISCELLANEOUS) ×3 IMPLANT
BUR MATCHSTICK NEURO 3.0 LAGG (BURR) IMPLANT
CANISTER SUCT 3000ML (MISCELLANEOUS) ×3 IMPLANT
CATH STG VENTRICULAR ×3 IMPLANT
CATH VENTRIC 35X38 W/TROCAR LG (CATHETERS) ×3 IMPLANT
CLIP RANEY DISP (INSTRUMENTS) IMPLANT
CLOSURE WOUND 1/4X4 (GAUZE/BANDAGES/DRESSINGS)
CONT SPEC 4OZ CLIKSEAL STRL BL (MISCELLANEOUS) IMPLANT
DECANTER SPIKE VIAL GLASS SM (MISCELLANEOUS) ×3 IMPLANT
DRAPE INCISE IOBAN 85X60 (DRAPES) ×3 IMPLANT
DRAPE ORTHO SPLIT 77X108 STRL (DRAPES) ×4
DRAPE POUCH INSTRU U-SHP 10X18 (DRAPES) ×3 IMPLANT
DRAPE SURG ORHT 6 SPLT 77X108 (DRAPES) ×2 IMPLANT
DRSG OPSITE POSTOP 3X4 (GAUZE/BANDAGES/DRESSINGS) ×6 IMPLANT
DURAPREP 26ML APPLICATOR (WOUND CARE) ×6 IMPLANT
ELECT REM PT RETURN 9FT ADLT (ELECTROSURGICAL) ×3
ELECTRODE REM PT RTRN 9FT ADLT (ELECTROSURGICAL) ×1 IMPLANT
GAUZE SPONGE 4X4 12PLY STRL (GAUZE/BANDAGES/DRESSINGS) ×3 IMPLANT
GAUZE SPONGE 4X4 16PLY XRAY LF (GAUZE/BANDAGES/DRESSINGS) IMPLANT
GLOVE BIO SURGEON STRL SZ7.5 (GLOVE) IMPLANT
GLOVE BIOGEL PI IND STRL 7.5 (GLOVE) IMPLANT
GLOVE BIOGEL PI IND STRL 8.5 (GLOVE) ×1 IMPLANT
GLOVE BIOGEL PI INDICATOR 7.5 (GLOVE)
GLOVE BIOGEL PI INDICATOR 8.5 (GLOVE) ×2
GLOVE ECLIPSE 8.5 STRL (GLOVE) ×3 IMPLANT
GLOVE EXAM NITRILE LRG STRL (GLOVE) IMPLANT
GLOVE EXAM NITRILE MD LF STRL (GLOVE) IMPLANT
GLOVE EXAM NITRILE XL STR (GLOVE) IMPLANT
GLOVE EXAM NITRILE XS STR PU (GLOVE) IMPLANT
GOWN STRL REUS W/ TWL LRG LVL3 (GOWN DISPOSABLE) IMPLANT
GOWN STRL REUS W/ TWL XL LVL3 (GOWN DISPOSABLE) ×1 IMPLANT
GOWN STRL REUS W/TWL 2XL LVL3 (GOWN DISPOSABLE) ×3 IMPLANT
GOWN STRL REUS W/TWL LRG LVL3 (GOWN DISPOSABLE)
GOWN STRL REUS W/TWL XL LVL3 (GOWN DISPOSABLE) ×2
HEMOSTAT SURGICEL 2X14 (HEMOSTASIS) ×3 IMPLANT
KIT BASIN OR (CUSTOM PROCEDURE TRAY) ×3 IMPLANT
KIT REMOVER STAPLE SKIN (MISCELLANEOUS) ×3 IMPLANT
KIT ROOM TURNOVER OR (KITS) ×3 IMPLANT
NEEDLE HYPO 25X1 1.5 SAFETY (NEEDLE) ×3 IMPLANT
NS IRRIG 1000ML POUR BTL (IV SOLUTION) ×3 IMPLANT
PACK LAMINECTOMY NEURO (CUSTOM PROCEDURE TRAY) ×3 IMPLANT
PAD ARMBOARD 7.5X6 YLW CONV (MISCELLANEOUS) ×9 IMPLANT
SHEATH PERITONEAL INTRO 46 (MISCELLANEOUS) IMPLANT
SHEATH PERITONEAL INTRO 61 (MISCELLANEOUS) IMPLANT
SPONGE LAP 4X18 X RAY DECT (DISPOSABLE) IMPLANT
SPONGE SURGIFOAM ABS GEL SZ50 (HEMOSTASIS) ×3 IMPLANT
STAPLER SKIN PROX WIDE 3.9 (STAPLE) ×3 IMPLANT
STRIP CLOSURE SKIN 1/4X4 (GAUZE/BANDAGES/DRESSINGS) IMPLANT
SUT ETHILON 3 0 PS 1 (SUTURE) IMPLANT
SUT NURALON 4 0 TR CR/8 (SUTURE) IMPLANT
SUT SILK 0 TIES 10X30 (SUTURE) IMPLANT
SUT SILK 1 TIES 10X30 (SUTURE) IMPLANT
SUT SILK 2 0 TIES 10X30 (SUTURE) ×3 IMPLANT
SUT SILK 3 0 SH 30 (SUTURE) IMPLANT
SUT VIC AB 2-0 CP2 18 (SUTURE) ×3 IMPLANT
SUT VIC AB 3-0 SH 8-18 (SUTURE) ×3 IMPLANT
TOWEL OR 17X24 6PK STRL BLUE (TOWEL DISPOSABLE) ×3 IMPLANT
TOWEL OR 17X26 10 PK STRL BLUE (TOWEL DISPOSABLE) ×3 IMPLANT
TRAY FOLEY CATH 14FRSI W/METER (CATHETERS) IMPLANT
UNDERPAD 30X30 INCONTINENT (UNDERPADS AND DIAPERS) ×3 IMPLANT
WATER STERILE IRR 1000ML POUR (IV SOLUTION) ×3 IMPLANT

## 2014-08-08 NOTE — Op Note (Signed)
Date of operation: 08/08/2014 Preoperative diagnosis: Obstructive hydrocephalus status post ventriculoperitoneal shunting, malposition of ventricular catheter Postoperative diagnosis: Obstructive hydrocephalus, status post ventriculoperitoneal shunting, malposition of ventricular catheter Procedure: Revision of ventricular catheter placement from right parieto-occipital to right frontal Surgeon: Barnett AbuHenry Vassie Kugel M.D. Anesthesia: Gen. endotracheal Indications: Wendy Delgado is a 28 year old Hispanic female who has cysticercosis with obstructive hydrocephalus. She had an intraventricular catheter placed initially which seemed to work well. A right parietal ventriculoperitoneal shunt was placed this morning, however when the patient failed to wake up adequately was noted that her catheter was placed through the atrium of the lateral ventricle into the region of the thalamus. It is not draining properly. She is taken back to the operating room to undergo revision of ventricular catheter placement which will be placed into the right frontal region.  Procedure: With the patient placed under general anesthesia the right frontal and right parietal scalp was prepped with alcohol and then DuraPrep and draped sterilely. Previously placed staples were removed the scalp incision had 1 sutures removed. Both incisions were opened. The ventricular catheter was easily removed and was obstructed with some debris. It was disconnected from the ventricular shunt valve. Then a standard ventriculostomy catheter was placed into the right lateral ventricle through the same passage that was previously noted as it exceeded spinal fluid freely. The catheter was then tunneled subcutaneously to the parieto-occipital incision. Here the catheter was trimmed connected to the ventriculoperitoneal shunting device and sutured in place. The catheter pump freely. The catheter was placed in at length of 6 cm. The wounds were closed with 2-0 Vicryl  in the galea and surgical staples on the scalp. They were redressed. There is a considerable amount of blood loss during the passing of the catheter subcutaneously from the frontal to the parietal incisions. This was controlled with tamponade. Blood loss was estimated at 100 mL.

## 2014-08-08 NOTE — Anesthesia Procedure Notes (Signed)
Procedure Name: Intubation Date/Time: 08/08/2014 4:19 PM Performed by: Adonis HousekeeperNGELL, Tasha Jindra M Pre-anesthesia Checklist: Patient identified, Emergency Drugs available, Suction available and Patient being monitored Patient Re-evaluated:Patient Re-evaluated prior to inductionOxygen Delivery Method: Circle system utilized Preoxygenation: Pre-oxygenation with 100% oxygen Intubation Type: IV induction Ventilation: Mask ventilation without difficulty Laryngoscope Size: Mac and 3 Grade View: Grade II Tube type: Oral Tube size: 7.0 mm Number of attempts: 1 Airway Equipment and Method: Stylet Placement Confirmation: ETT inserted through vocal cords under direct vision,  positive ETCO2 and breath sounds checked- equal and bilateral Secured at: 22 cm Tube secured with: Tape Dental Injury: Teeth and Oropharynx as per pre-operative assessment

## 2014-08-08 NOTE — Anesthesia Preprocedure Evaluation (Addendum)
Anesthesia Evaluation  Patient identified by MRN, date of birth, ID band Patient awake    Reviewed: Allergy & Precautions, H&P , NPO status , Patient's Chart, lab work & pertinent test results  History of Anesthesia Complications Negative for: history of anesthetic complications  Airway Mallampati: II  TM Distance: >3 FB Neck ROM: Full    Dental  (+) Teeth Intact, Dental Advisory Given   Pulmonary neg pulmonary ROS,    + decreased breath sounds      Cardiovascular negative cardio ROS  Rhythm:Regular Rate:Normal     Neuro/Psych  Headaches, Seizures -,     GI/Hepatic negative GI ROS, Neg liver ROS,   Endo/Other  negative endocrine ROS  Renal/GU negative Renal ROS     Musculoskeletal negative musculoskeletal ROS (+)   Abdominal (+) + obese,   Peds  Hematology negative hematology ROS (+)   Anesthesia Other Findings   Reproductive/Obstetrics negative OB ROS                            Anesthesia Physical Anesthesia Plan  ASA: III  Anesthesia Plan: General   Post-op Pain Management:    Induction: Intravenous  Airway Management Planned: Oral ETT  Additional Equipment:   Intra-op Plan:   Post-operative Plan: Extubation in OR  Informed Consent: I have reviewed the patients History and Physical, chart, labs and discussed the procedure including the risks, benefits and alternatives for the proposed anesthesia with the patient or authorized representative who has indicated his/her understanding and acceptance.   Dental advisory given  Plan Discussed with: Anesthesiologist and CRNA  Anesthesia Plan Comments: (    )      Anesthesia Quick Evaluation

## 2014-08-08 NOTE — Progress Notes (Signed)
PT Cancellation Note  Patient Details Name: Baxter HireFlorencia Siegman MRN: [id number] DOB: May 27, 1986   Cancelled Treatment:    Reason Eval/Treat Not Completed: Patient at procedure or test/unavailable.  Pt for VP Shunt this am.  Will hold PT and f/u tomorrow.     Samuell Knoble, Alison MurrayMegan F 08/08/2014, 7:46 AM

## 2014-08-08 NOTE — Op Note (Signed)
Date of surgery: 08/08/2014 Preoperative diagnosis: Obstructive hydrocephalus Postoperative diagnosis: Obstructive hydrocephalus with neurocysticercosis Procedure: Right parietal ventriculoperitoneal shunt Surgeon: Barnett AbuHenry Aeisha Minarik Anesthesia: Gen. endotracheal Indications: The patient is a 28 year old [place] immigrant who fell ill about a week ago with severe headache obtundation and ultimately extensor posturing and herniation. CT scan demonstrated acute obstructive hydrocephalus an MRI demonstrates that there is a mass in the posterior third ventricle consistent with a cyst of  Neurocysticercosis. Patient had a ventriculostomy placed but now permanent shunt is being placed.  Procedure: The patient was brought to the operating room supine on a stretcher after the smooth induction of general endotracheal anesthesia patient's head was turned to the left side in the right parietal and occipital regions were shaved prepped with alcohol and DuraPrep and draped in a sterile fashion all the way down to the right side of the neck the right anterior chest wall in the right upper quadrant of the abdomen. A skin incision was marked in the posterior parietal region on the right side as it was in the right upper quadrant. The procedure was started by making a small transverse incision in the right upper quadrant was dissected down to the anterior rectus fascia which was incised. The rectus muscle was separated and the posterior rectus sheath was identified a opening was made through the posterior rectus sheath into the abdominal cavity and a pursestring suture of 3-0 silk was placed around this opening. The cranial incision was then made and the subcutaneous tissue was dissected inferiorly into the neck to create a small patch then a shunt passer was passed in the subcutaneous tissues all the way down to the abdominal incision. The inner cannula was removed and the outer she was left in place. A 3-0 silk suture was tied  to the inner sheath before being pulled. To this the ventricular end of the catheter was attached and this was passed subcutaneously the outer sheath was removed. The proximal portion was then connected to a ventricular catheter which was passed after creating a burr hole in the right parietal region. The dura was identified and cauterized with bipolar cautery and opened with 15 blade. 2 separate passes into the ventricle were used as there was significant obstruction and some debris that clogged the first catheter pass. Second catheter pass yielded clear spinal fluid. This system was then connected to the valve which was preset at 100 mm of water pressure. The system pumped easily and flowed spinal fluid distally without obstruction. The galea was then closed with 2-0 Vicryl in interrupted fashion and surgical staples were placed in the scalp. The peritoneal catheter was then inserted into the peritoneum the pursestring suture was snugged around the catheter only enough so that the catheter could slide easily in and out of the peritoneal cavity. The anterior rectus sheath was then closed with 2-0 Vicryl sutures 2-0 Vicryl Darl PikesSusan 16 is tissues and 3-0 Vicryl subcuticularly to close the abdominal wall skin. Dermabond was placed on the abdominal wall incision. The right parietal scalp was dressed with a honeycomb dressing. The drapes were then removed and the site of the ventricular catheter was then prepped with alcohol. The stasis or was released and the ventricular catheter was removed a figure-of-eight nylon suture was used to close the exit wound as it freely flowed CSF. No dressing was placed on this area. She was then returned to recovery room in stable condition.

## 2014-08-08 NOTE — Anesthesia Preprocedure Evaluation (Signed)
Anesthesia Evaluation   Patient unresponsive    Airway        Dental   Pulmonary          Cardiovascular     Neuro/Psych  Headaches,    GI/Hepatic   Endo/Other    Renal/GU      Musculoskeletal   Abdominal   Peds  Hematology   Anesthesia Other Findings   Reproductive/Obstetrics                             Anesthesia Physical Anesthesia Plan  ASA: II  Anesthesia Plan: General   Post-op Pain Management:    Induction:   Airway Management Planned:   Additional Equipment:   Intra-op Plan:   Post-operative Plan:   Informed Consent:   Plan Discussed with:   Anesthesia Plan Comments:         Anesthesia Quick Evaluation

## 2014-08-08 NOTE — Transfer of Care (Signed)
Immediate Anesthesia Transfer of Care Note  Patient: Wendy Delgado  Procedure(s) Performed: Procedure(s): SHUNT REVISION VENTRICULAR-PERITONEAL (N/A)  Patient Location: PACU  Anesthesia Type:General  Level of Consciousness: pt is drowsy but will follow commands  Airway & Oxygen Therapy: Patient Spontanous Breathing and Patient connected to nasal cannula oxygen  Post-op Assessment: Report given to PACU RN, Post -op Vital signs reviewed and stable, Patient moving all extremities and Patient moving all extremities X 4  Post vital signs: Reviewed and stable  Complications: No apparent anesthesia complications

## 2014-08-08 NOTE — Progress Notes (Signed)
Patient ID: Wendy Delgado, female   DOB: 09/08/1985, 28 y.o.   MRN: [id number] Vital signs are stable Patient seems awake alert Will check CT in the a.m. check ventricular catheter placement Observe

## 2014-08-08 NOTE — Anesthesia Postprocedure Evaluation (Signed)
  Anesthesia Post-op Note  Patient: Wendy Delgado  Procedure(s) Performed: Procedure(s): SHUNT INSERTION VENTRICULAR-PERITONEAL (Right)  Patient Location: ICU  Anesthesia Type:General  Level of Consciousness: lethargic  Airway and Oxygen Therapy: Patient Spontanous Breathing  Post-op Pain: none  Post-op Assessment: Post-op Vital signs reviewed, Patient's Cardiovascular Status Stable, Respiratory Function Stable, Patent Airway, No signs of Nausea or vomiting and Pain level controlled  Post-op Vital Signs: Reviewed and stable  Last Vitals:  Filed Vitals:   08/08/14 1400  BP: 109/59  Pulse: 47  Temp:   Resp: 13    Complications: No apparent anesthesia complications. Shunt failure heading back to OR

## 2014-08-08 NOTE — Progress Notes (Signed)
MD notified of patient becoming more lethargic and harder to arouse. CT of the head ordered stat. Will continue to monitor closely.

## 2014-08-08 NOTE — Anesthesia Postprocedure Evaluation (Signed)
  Anesthesia Post-op Note  Patient: Wendy Delgado  Procedure(s) Performed: Procedure(s): SHUNT REVISION VENTRICULAR-PERITONEAL (N/A)  Patient Location: PACU  Anesthesia Type: General   Level of Consciousness: awake, alert  and oriented  Airway and Oxygen Therapy: Patient Spontanous Breathing  Post-op Pain: mild  Post-op Assessment: Post-op Vital signs reviewed  Post-op Vital Signs: Reviewed  Last Vitals:  Filed Vitals:   08/08/14 1723  BP:   Pulse:   Temp: 36.3 C  Resp:     Complications: No apparent anesthesia complications

## 2014-08-08 NOTE — Transfer of Care (Signed)
Immediate Anesthesia Transfer of Care Note  Patient: Wendy HireFlorencia Daoud  Procedure(s) Performed: Procedure(s): SHUNT INSERTION VENTRICULAR-PERITONEAL (Right)  Patient Location: PACU  Anesthesia Type:General  Level of Consciousness: patient cooperative and responds to stimulation  Airway & Oxygen Therapy: Patient Spontanous Breathing and Patient connected to face mask oxygen  Post-op Assessment: Report given to PACU RN, Post -op Vital signs reviewed and stable and Patient moving all extremities  Post vital signs: Reviewed and stable  Complications: No apparent anesthesia complications

## 2014-08-09 ENCOUNTER — Encounter (HOSPITAL_COMMUNITY): Payer: Self-pay | Admitting: Neurological Surgery

## 2014-08-09 ENCOUNTER — Inpatient Hospital Stay (HOSPITAL_COMMUNITY): Payer: Medicaid Other

## 2014-08-09 MED ORDER — PRO-STAT SUGAR FREE PO LIQD
30.0000 mL | Freq: Every day | ORAL | Status: DC
  Administered 2014-08-11 – 2014-08-15 (×5): 30 mL via ORAL
  Filled 2014-08-09 (×8): qty 30

## 2014-08-09 MED ORDER — ENSURE COMPLETE PO LIQD
237.0000 mL | Freq: Two times a day (BID) | ORAL | Status: DC
  Administered 2014-08-09 – 2014-08-15 (×12): 237 mL via ORAL

## 2014-08-09 NOTE — Progress Notes (Signed)
NUTRITION FOLLOW UP  INTERVENTION: Provide Ensure Complete po BID, each supplement provides 350 kcal and 13 grams of protein.  Provide 30 ml Prostat once daily, each supplement provides 100 kcal and 15 grams of protein.  Encourage PO intake.  NUTRITION DIAGNOSIS: Inadequate oral intake related to inability to eat as evidenced by NPO status; advanced to full liquid diet; progressing  Goal:  Pt to meet >/= 90% of their estimated nutrition needs; progressing  Monitor:  PO intake, weight trends, labs, I/O;s  28 y.o. female  Admitting Dx: Hydrocephalus  ASSESSMENT: Pt with no PMH admitted after being found down by husband.  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  Pt had ventricular drainage catheter inserted 12/23.   Pt extubated 12/24.  Procedure (12/29):  Right parietal ventriculoperitoneal shunt Revision of ventricular catheter placement from right parieto-occipital to right frontal  Pt was asleep and would not wake during time of visit. No family/visitors at bedside. Noted breakfast meal tray was untouched. Previous meal completion has been 50%. RD to order oral supplements to help in caloric and protein needs.  Labs and medications reviewed.  Height: Ht Readings from Last 1 Encounters:  08/02/14 5\' 1"  (1.549 m)    Weight: Wt Readings from Last 1 Encounters:  08/09/14 191 lb 12.8 oz (87 kg)  08/03/14 204 lbs  BMI:  Body mass index is 36.26 kg/(m^2). Class II obesity  Re-Estimated Nutritional Needs: Kcal: 1900-2100 Protein: 105-120 grams  Fluid: >/= 1.9 L/day  Skin: Incision on right head and abdomen.  Diet Order: Diet full liquid   Intake/Output Summary (Last 24 hours) at 08/09/14 1014 Last data filed at 08/09/14 0900  Gross per 24 hour  Intake   1400 ml  Output   1250 ml  Net    150 ml    Last BM: 12/27  Labs:   Recent Labs Lab 08/03/14 0228 08/04/14 0327 08/06/14 0605  NA 143 140 142  K 3.1* 3.4* 3.6  CL 114* 109 109   CO2 23 22 24   BUN <5* <5* 7  CREATININE 0.53 0.45* 0.65  CALCIUM 8.4 8.4 9.1  MG 2.0  --   --   PHOS 2.0*  --   --   GLUCOSE 110* 81 103*    CBG (last 3)  No results for input(s): GLUCAP in the last 72 hours.  Scheduled Meds: . enoxaparin (LOVENOX) injection  50 mg Subcutaneous Daily    Continuous Infusions:    Past Medical History  Diagnosis Date  . Late prenatal care   . Headache     History reviewed. No pertinent past surgical history.  Marijean NiemannStephanie La, MS, RD, LDN Pager # [phone] After hours/ weekend pager # [phone]

## 2014-08-09 NOTE — Progress Notes (Signed)
Patient ID: Wendy Delgado, female   DOB: Sep 08, 1985, 28 y.o.   MRN: [id number] Arouses more easily today. Complains a little bit of headache. Has soreness in region of surgery No evidence of a drift Patient may be mobilized ad lib. At this point I would defer to infectious disease whether she should be treated with antihelminthic agents as it is not likely that she would be a candidate for  surgical resection. Patient may be discharged when she is stable and I will see her as an outpatient for further evaluation and suture removal.

## 2014-08-09 NOTE — Progress Notes (Signed)
UR completed. Pt had been seen prior to shunt placement by acute therapies and no HH needs were identified at that time.  Await reassessment postop.  Carlyle LipaMichelle Blayton Huttner, RN BSN [facility] CCM Trauma/Neuro ICU Case Manager [phone]

## 2014-08-09 NOTE — Progress Notes (Signed)
Physical Therapy Treatment Patient Details Name: Wendy Delgado MRN: [id number] DOB: [birth year] Today's Date: 08/09/2014    History of Present Illness Patient is a 28 [place] who presented to [facility] on 08/01/14 after being found unresponsive.  She had been complaining of a severe headache and had a generalized seizure in the ED.  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  Intubated 12/22-12/24.  Shunt placed 12/29.    PT Comments    Pt continues with balance deficits and requiring MinA to maintain balance while ambulating very short distances.  Feel pt not appropriate for returning straight to home at this time 2/2 continued need for A with only minimal activity.  Pt c/o dizziness and headache.  Feel pt would benefit from CIR at D/C to maximize independence and decrease burden of care prior to returning to home.  ? Pt having visual deficits as pt unable to locate toilet paper in bathroom and even when PT pointed it out pt having trouble finding it on wall.  Pt also having difficulty performing peri hygiene and seemed unconcerned about having BM on her hands and needed prompting from PT to wash hands.  Will continue to follow  Follow Up Recommendations  CIR     Equipment Recommendations  Rolling walker with 5" wheels    Recommendations for Other Services Rehab consult     Precautions / Restrictions Precautions Precautions: Fall Precaution Comments: IVC pulled and pt now with a shunt Restrictions Weight Bearing Restrictions: No    Mobility  Bed Mobility Overal bed mobility: Needs Assistance Bed Mobility: Supine to Sit     Supine to sit: Min assist     General bed mobility comments: reaches for assist  Transfers Overall transfer level: Needs assistance Equipment used: 1 person hand held  assist Transfers: Sit to/from Stand Sit to Stand: Min assist         General transfer comment: pt unsteady and needs UE support to maintain balance.    Ambulation/Gait Ambulation/Gait assistance: Min assist Ambulation Distance (Feet): 8 Feet (x2) Assistive device: 1 person hand held assist Gait Pattern/deviations: Step-through pattern;Decreased stride length     General Gait Details: pt generally unsteady and requires constant MinA to maintain balance.  pt staggers and reachs for walls and door frame for support.     Stairs            Wheelchair Mobility    Modified Rankin (Stroke Patients Only)       Balance Overall balance assessment: Needs assistance Sitting-balance support: No upper extremity supported;Feet supported Sitting balance-Leahy Scale: Fair Sitting balance - Comments: Unable to accept balance challenges without support   Standing balance support: No upper extremity supported Standing balance-Leahy Scale: Poor Standing balance comment: pt requires constant minA to maintain balance and reaching for UE supports.                      Cognition Arousal/Alertness: Lethargic Behavior During Therapy: Flat affect Overall Cognitive Status: Difficult to assess                      Exercises      General Comments        Pertinent Vitals/Pain Pain Assessment: Faces Faces Pain Scale: Hurts little more Pain Location: Headache pt states is "medium" level of pain.   Pain Descriptors / Indicators:  Headache Pain Intervention(s): Monitored during session;Repositioned    Home Living                      Prior Function            PT Goals (current goals can now be found in the care plan section) Acute Rehab PT Goals Patient Stated Goal: return home with family PT Goal Formulation: With patient Time For Goal Achievement: 08/13/14 Potential to Achieve Goals: Good Progress towards PT goals: Progressing toward goals     Frequency  Min 3X/week    PT Plan Discharge plan needs to be updated    Co-evaluation             End of Session Equipment Utilized During Treatment: Gait belt Activity Tolerance: Patient limited by fatigue Patient left: in chair;with call bell/phone within reach     Time: 1435-1505 PT Time Calculation (min) (ACUTE ONLY): 30 min  Charges:  $Gait Training: 8-22 mins $Therapeutic Activity: 8-22 mins                    G Codes:      Wendy Delgado 08/09/2014, 3:28 PM

## 2014-08-09 NOTE — Progress Notes (Signed)
[facility]  ID: Wendy Delgado is a 28 year old Hispanic female who has cysticercosis with obstructive hydrocephalus. She had an intraventricular catheter placed initially which seemed to work well. A right parietal ventriculoperitoneal shunt was placed this morning, however when the patient failed to wake up adequately was noted that her catheter was placed through the atrium of the lateral ventricle into the region of the thalamus. It is not draining properly. She is taken back to the OR on 12/29 to undergo revision of ventricular catheter placement which will be placed into the right frontal region.  Day #2 cefazolin Subjective: Afebrile, pod#1 from revision of ventricular catheter placement from right parieto-occipital to right frontal   Antibiotics:  Anti-infectives    Start     Dose/Rate Route Frequency Ordered Stop   08/09/14 0000  ceFAZolin (ANCEF) IVPB 1 g/50 mL premix     1 g100 mL/hr over 30 Minutes Intravenous Every 8 hours 08/08/14 1810 08/09/14 0855   08/08/14 1703  bacitracin 50,000 Units in sodium chloride irrigation 0.9 % 500 mL irrigation  Status:  Discontinued       As needed 08/08/14 1703 08/08/14 1719   08/08/14 0845  bacitracin 50,000 Units in sodium chloride irrigation 0.9 % 500 mL irrigation  Status:  Discontinued       As needed 08/08/14 0917 08/08/14 1029      Medications: Scheduled Meds: . enoxaparin (LOVENOX) injection  50 mg Subcutaneous Daily  . feeding supplement (ENSURE COMPLETE)  237 mL Oral BID BM  . feeding supplement (PRO-STAT SUGAR FREE 64)  30 mL Oral Q1500    Objective: Weight change: 1 lb 5.2 oz (0.6 kg)  Intake/Output Summary (Last 24 hours) at 08/09/14 1103 Last data filed at 08/09/14 0900  Gross per 24 hour  Intake    800 ml  Output   1200 ml  Net   -400 ml   Blood pressure 95/42, pulse 52, temperature 98.2 F (36.8 C), temperature source Oral, resp. rate 17, height 5\' 1"  (1.549 m), weight 191 lb 12.8  oz (87 kg), last menstrual period 07/08/2014, SpO2 98 %, not currently breastfeeding. Temp:  [97.2 F (36.2 C)-99.2 F (37.3 C)] 98.2 F (36.8 C) (12/30 0756) Pulse Rate:  [47-87] 52 (12/30 1000) Resp:  [11-17] 17 (12/30 1000) BP: (74-122)/(42-78) 95/42 mmHg (12/30 1000) SpO2:  [95 %-100 %] 98 % (12/30 1000) Weight:  [191 lb 12.8 oz (87 kg)] 191 lb 12.8 oz (87 kg) (12/30 0600)  Physical Exam: Not examined  CBC:  CBC Latest Ref Rng 08/04/2014 08/03/2014 08/02/2014  WBC 4.0 - 10.5 K/uL 10.2 13.1(H) 16.7(H)  Hemoglobin 12.0 - 15.0 g/dL 11.5(L) 12.3 12.7  Hematocrit 36.0 - 46.0 % 35.1(L) 37.8 39.0  Platelets 150 - 400 K/uL 214 242 277    Micro Results: Recent Results (from the past 720 hour(s))  Urine culture     Status: None   Collection Time: 08/01/14  9:40 PM  Result Value Ref Range Status   Specimen Description URINE, CATHETERIZED  Final   Special Requests NONE  Final   Culture  Setup Time   Final    08/02/2014 14:10 Performed at [facility]    Colony Count NO GROWTH Performed at [facility]   Final   Culture NO GROWTH Performed at [facility]   Final   Report Status 08/03/2014 FINAL  Final  MRSA PCR Screening     Status: None   Collection Time: 08/02/14 12:25  AM  Result Value Ref Range Status   MRSA by PCR NEGATIVE NEGATIVE Final    Comment:        The GeneXpert MRSA Assay (FDA approved for NASAL specimens only), is one component of a comprehensive MRSA colonization surveillance program. It is not intended to diagnose MRSA infection nor to guide or monitor treatment for MRSA infections.     Studies/Results: Ct Head Wo Contrast  08/09/2014   CLINICAL DATA:  Evaluate shunt placement.  EXAM: CT HEAD WITHOUT CONTRAST  TECHNIQUE: Contiguous axial images were obtained from the base of the skull through the vertex without intravenous contrast.  COMPARISON:  CT of the head August 08, 2014  FINDINGS: Interval placement of new  ventriculoperitoneal shunt via high RIGHT frontal burr hole, distal tip traverses the RIGHT lateral ventricle, terminating in the RIGHT mesial thalamus. Interval removal of RIGHT parietal ventriculostomy catheter. Decreased ventricle size, anterior recess of the third ventricle was 13 mm, now 8 mm. Small amount of intraventricular pneumocephalus, air along the RIGHT parietal catheter tract. RIGHT frontal low density, new from prior MRI suggest retrograde flow cerebral spinal fluid. No intraparenchymal hemorrhage, mass effect, midline shift or acute large vascular territory infarct. Trace density along the RIGHT cerebellar tentorium.  No abnormal extra-axial fluid collections. Basal cisterns are patent.  Visualized paranasal sinuses and mastoid air cells are well aerated. RIGHT scalp hematoma and subcutaneous gas consistent with recent instrumentation.  IMPRESSION: Interval placement of RIGHT frontal ventriculoperitoneal shunt, resolving hydrocephalus. Interval removal of RIGHT parietal catheter.  Small amount of pneumocephalus. Trace density along the RIGHT cerebellar tentorium may be due to obliquity in the scanner, less likely extra-axial blood products.   Electronically Signed   By: Awilda Metroourtnay  Bloomer   On: 08/09/2014 05:46   Ct Head Wo Contrast  08/08/2014   CLINICAL DATA:  Hydrocephalus presumably related to neurocysticercosis. Ventricular peritoneal shunt placement.  EXAM: CT HEAD WITHOUT CONTRAST  TECHNIQUE: Contiguous axial images were obtained from the base of the skull through the vertex without intravenous contrast.  COMPARISON:  CT 08/01/2014, MRI 08/02/2014  FINDINGS: Right parietal ventricular catheter enters the atrium of the right lateral ventricle. The tip extends into the posterior right thalamus. Gas bubbles are present in the right thalamus. No associated hemorrhage.  Right frontal ventricular catheter tract is noted without hemorrhage.  Ventricle size significantly improved. Ventricles remain  mildly dilated.  Negative for acute hemorrhage. No shift of the midline structures. No brain calcifications are noted.  IMPRESSION: Right parietal ventricular catheter enters the right ventricle with the tip in the right thalamus. No associated hemorrhage. Right frontal ventricular catheter has been removed.  Improvement in ventricular size with mild residual hydrocephalus.   Electronically Signed   By: Marlan Palauharles  Clark M.D.   On: 08/08/2014 15:17      Assessment/Plan:  Active Problems:   Status epilepticus   Acute encephalopathy   Hydrocephalus   Neurocysticercosis    Wendy Delgado is a 28 y.o. 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 serum T Solium antibodies by ELISA is pending In addition to CSF for T solium antibodies by ELISA to [facility] labs  Recommend that she is transferred to academic center that is familiar with endoscopic removal of  Intraventricular cysts, since it is the mainstay of treatment for this presentation. Initiation of anti-helminthic treatment is contra-indicated prior to neurosurgery  for this presentation since rupture of cyst cna cause dramatic inflammatory response worsening hydrocephaly and possibly seizure, as well as weaken the cyst membrane, causing friability and/or adherence to the ventricular wall.  At transfer, would also seek ID input to when to initiate  anti-helminthic drug (albendazole) after neurosurgery and premedicating with high dose corticosteroids x 3-4 days prior to starting albendazole (typically 3 )  Recommend ophtho eval at academic center to exclude eye involvement since treatment initiation could also cause worsening inflammation if eye involvement, including blindness  - Strongyloides antibody in serum and check  Qferon gold as well as HIV antibody and hepatitis panel  Wendy Lerman B. Drue Second MD MPH [facility] [phone]     LOS: 8 days   Judyann Munson 08/09/2014, 11:03 AM

## 2014-08-09 NOTE — Progress Notes (Signed)
Rehab Admissions Coordinator Note:  Patient was screened by Trish MageLogue, Avarey Yaeger M for appropriateness for an Inpatient Acute Rehab Consult.  At this time, we are recommending Inpatient Rehab consult.  Trish MageLogue, Quavion Boule M 08/09/2014, 3:59 PM  I can be reached at [phone].

## 2014-08-09 NOTE — Progress Notes (Signed)
PULMONARY / CRITICAL CARE MEDICINE   Name: Baxter HireFlorencia Lurz MRN: [id number] DOB: [birth year]    ADMISSION DATE:  08/01/2014 CONSULTATION DATE:  08/09/2014  REFERRING MD :  [facility]  CHIEF COMPLAINT:  Status Epilepticus  INITIAL PRESENTATION:  28 y.o. F taken to [facility] on 12/22 after being found unresponsive by husband. Pt reportedly had severe headache earlier in afternoon.  While in ED, pt reportedly had a seizure which did not break and led to status epilepticus.  She was intubated and [facility]. Noted to have Obstructive hydrocephalus , required ventric   STUDIES:  CT Head >>> no hemorrhage, moderate hydrocephalus  SIGNIFICANT EVENTS: 12/22 - taken to [facility] after being found unresponsive.  Reportedly had seizures and status, intubated and [facility] 12/29 VP shunt >>repositioned    SUBJECTIVE: Awake, afebrile mild Headache   VITAL SIGNS: Temp:  [97.2 F (36.2 C)-99.2 F (37.3 C)] 98.2 F (36.8 C) (12/30 0756) Pulse Rate:  [47-87] 56 (12/30 0800) Resp:  [11-17] 15 (12/30 0800) BP: (74-122)/(46-78) 103/48 mmHg (12/30 0800) SpO2:  [95 %-100 %] 98 % (12/30 0800) Weight:  [87 kg (191 lb 12.8 oz)] 87 kg (191 lb 12.8 oz) (12/30 0600) HEMODYNAMICS:   VENTILATOR SETTINGS:   INTAKE / OUTPUT: Intake/Output      12/29 0701 - 12/30 0700 12/30 0701 - 12/31 0700   P.O.     I.V. (mL/kg) 2200 (25.3)    IV Piggyback 150    Total Intake(mL/kg) 2350 (27)    Urine (mL/kg/hr) 1225 (0.6)    Drains     Blood 50 (0)    Total Output 1275     Net +1075           PHYSICAL EXAMINATION: General: Young female, in NAD.Awake andalert Neuro: Follows commands, non focal, speaks spanish HEENT: /AT. Pupils very sluggish, sclerae anicteric. Cardiovascular: RRR, no M/R/G.  Lungs: Respirations even and unlabored.  CTA bilaterally, No W/R/R.  Abdomen: BS x 4, soft, NT/ND.  Musculoskeletal: No gross deformities, no edema. Skin: Intact, warm, no  rashes.  LABS:  CBC  Recent Labs Lab 08/03/14 0228 08/04/14 0327  WBC 13.1* 10.2  HGB 12.3 11.5*  HCT 37.8 35.1*  PLT 242 214   Coag's No results for input(s): APTT, INR in the last 168 hours. BMET  Recent Labs Lab 08/03/14 0228 08/04/14 0327 08/06/14 0605  NA 143 140 142  K 3.1* 3.4* 3.6  CL 114* 109 109  CO2 23 22 24   BUN <5* <5* 7  CREATININE 0.53 0.45* 0.65  GLUCOSE 110* 81 103*   Electrolytes  Recent Labs Lab 08/03/14 0228 08/04/14 0327 08/06/14 0605  CALCIUM 8.4 8.4 9.1  MG 2.0  --   --   PHOS 2.0*  --   --    Sepsis Markers No results for input(s): LATICACIDVEN, PROCALCITON, O2SATVEN in the last 168 hours. ABG  Recent Labs Lab 08/03/14 0358  PHART 7.423  PCO2ART 32.5*  PO2ART 173.0*   Liver Enzymes No results for input(s): AST, ALT, ALKPHOS, BILITOT, ALBUMIN in the last 168 hours. Cardiac Enzymes No results for input(s): TROPONINI, PROBNP in the last 168 hours. Glucose No results for input(s): GLUCAP in the last 168 hours.  Imaging Ct Head Wo Contrast  08/08/2014   CLINICAL DATA:  Hydrocephalus presumably related to neurocysticercosis. Ventricular peritoneal shunt placement.  EXAM: CT HEAD WITHOUT CONTRAST  TECHNIQUE: Contiguous axial images were obtained from the base of the skull through the vertex  without intravenous contrast.  COMPARISON:  CT 08/01/2014, MRI 08/02/2014  FINDINGS: Right parietal ventricular catheter enters the atrium of the right lateral ventricle. The tip extends into the posterior right thalamus. Gas bubbles are present in the right thalamus. No associated hemorrhage.  Right frontal ventricular catheter tract is noted without hemorrhage.  Ventricle size significantly improved. Ventricles remain mildly dilated.  Negative for acute hemorrhage. No shift of the midline structures. No brain calcifications are noted.  IMPRESSION: Right parietal ventricular catheter enters the right ventricle with the tip in the right thalamus. No  associated hemorrhage. Right frontal ventricular catheter has been removed.  Improvement in ventricular size with mild residual hydrocephalus.   Electronically Signed   By: Marlan Palauharles  Clark M.D.   On: 08/08/2014 15:17    ASSESSMENT / PLAN:   NEUROLOGIC A:   Acute metabolic encephalopathy(resolved 12/24) New onset seizures Hydrocephalus s/p vP shunt Neurocysticercosis  P:   Await plan from Neurosurgery &ID-may need referral to tertiary center for definitve surgery eventually -may need eye exam  PT/OT  PULMONARY OETT 12/22 >>>12/24 A: Acute respiratory failure P:  IS while in bed   GASTROINTESTINAL A:   Nutrition P:  Reg diet Replete K   HEMATOLOGIC A:   VTE Prophylaxis P:  SCD's / Heparin. CBC  INFECTIOUS A:  Neuricysticercosis P:   Await ELISA . Would need steroids & antihelminthic eventually-defer to ID  ENDOCRINE A:   No known issues P:   SSI if glucose consistently > 180.  Family updated: None.  Interdisciplinary Family Meeting v Palliative Care Meeting:  NA  Summary -  Await  Plan - surgery vs medical Rx - stay in ICU  plan & disease condition has been explained to pt & family  Oretha MilchALVA,Bettyjo Lundblad V. MD 08/09/2014, 9:51 AM

## 2014-08-10 ENCOUNTER — Encounter (HOSPITAL_COMMUNITY): Payer: Self-pay | Admitting: Neurological Surgery

## 2014-08-10 DIAGNOSIS — J9690 Respiratory failure, unspecified, unspecified whether with hypoxia or hypercapnia: Secondary | ICD-10-CM | POA: Insufficient documentation

## 2014-08-10 DIAGNOSIS — J9601 Acute respiratory failure with hypoxia: Secondary | ICD-10-CM

## 2014-08-10 DIAGNOSIS — G911 Obstructive hydrocephalus: Secondary | ICD-10-CM | POA: Insufficient documentation

## 2014-08-10 LAB — BASIC METABOLIC PANEL
ANION GAP: 7 (ref 5–15)
BUN: 7 mg/dL (ref 6–23)
CHLORIDE: 107 meq/L (ref 96–112)
CO2: 26 mmol/L (ref 19–32)
Calcium: 9 mg/dL (ref 8.4–10.5)
Creatinine, Ser: 0.55 mg/dL (ref 0.50–1.10)
GFR calc Af Amer: >90 mL/min (ref >90)
GFR calc non Af Amer: >90 mL/min (ref >90)
Glucose, Bld: 95 mg/dL (ref 70–99)
Potassium: 3.7 mmol/L (ref 3.5–5.1)
Sodium: 140 mmol/L (ref 135–145)

## 2014-08-10 LAB — CBC
HCT: 40.4 % (ref 36.0–46.0)
Hemoglobin: 13.2 g/dL (ref 12.0–15.0)
MCH: 30.8 pg (ref 26.0–34.0)
MCHC: 32.7 g/dL (ref 30.0–36.0)
MCV: 94.4 fL (ref 78.0–100.0)
Platelets: 287 10*3/uL (ref 150–400)
RBC: 4.28 MIL/uL (ref 3.87–5.11)
RDW: 12.5 % (ref 11.5–15.5)
WBC: 9.3 10*3/uL (ref 4.0–10.5)

## 2014-08-10 LAB — STRONGYLOIDES ANTIBODY: STRONGYLOIDES AB: NEGATIVE

## 2014-08-10 MED ORDER — POTASSIUM CHLORIDE CRYS ER 20 MEQ PO TBCR
20.0000 meq | EXTENDED_RELEASE_TABLET | ORAL | Status: AC
  Administered 2014-08-10 (×2): 20 meq via ORAL
  Filled 2014-08-10 (×2): qty 1

## 2014-08-10 MED ORDER — DEXAMETHASONE 4 MG PO TABS
4.0000 mg | ORAL_TABLET | Freq: Three times a day (TID) | ORAL | Status: DC
  Administered 2014-08-10 – 2014-08-14 (×11): 4 mg via ORAL
  Filled 2014-08-10 (×14): qty 1

## 2014-08-10 MED ORDER — POTASSIUM CHLORIDE 10 MEQ/100ML IV SOLN
10.0000 meq | INTRAVENOUS | Status: DC
  Administered 2014-08-10: 10 meq via INTRAVENOUS
  Filled 2014-08-10: qty 100

## 2014-08-10 MED ORDER — DEXAMETHASONE SODIUM PHOSPHATE 4 MG/ML IJ SOLN: 4.0000 mg | Freq: Three times a day (TID) | INTRAMUSCULAR | Status: DC

## 2014-08-10 NOTE — Progress Notes (Signed)
Occupational Therapy Treatment Patient Details Name: Wendy HireFlorencia Hollick MRN: [id number] DOB: 01-Nov-1985 Today's Date: 08/10/2014    History of present illness Patient is a 28 y[place]o. [place] who presented to [facility] on 08/01/14 after being found unresponsive.  She had been complaining of a severe headache and had a generalized seizure in the ED.  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  Intubated 12/22-12/24.  Shunt placed 12/29.   OT comments  Pt up in chair x 45 minutes prior to OTs arrival, easily aroused, but lethargic with eyes closed much of the time. Focus of session on grooming.  Tolerated 1 activity in standing and returned to sitting to perform the second.  Pt able to sequence hand washing and oral care.  Continues to be unsteady in standing, requiring hand held assist.    Follow Up Recommendations  CIR;Supervision/Assistance - 24 hour    Equipment Recommendations       Recommendations for Other Services      Precautions / Restrictions Precautions Precautions: Fall Precaution Comments: IVC pulled and pt now with a shunt Restrictions Weight Bearing Restrictions: No       Mobility Bed Mobility               General bed mobility comments: pt up in chair  Transfers Overall transfer level: Needs assistance Equipment used: 1 person hand held assist Transfers: Sit to/from Stand Sit to Stand: Min assist              Balance             Standing balance-Leahy Scale: Poor                     ADL Overall ADL's : Needs assistance/impaired Eating/Feeding: Set up;Sitting Eating/Feeding Details (indicate cue type and reason): poor appetite and ability to stay alert Grooming: Oral care;Supervision/safety;Sitting;Wash/dry hands;Standing Grooming Details (indicate cue type and  reason): Pt able to sequence brushing her teeth and to locate ADL items spread across her over bed table. Pt ambulated with hand held assist to sink and washed her hands with cues to locate soap on wall and paper towels.                             Functional mobility during ADLs: Minimal assistance General ADL Comments: Pt cooperative, but with low activity tolerance and tendency to keep eyes closed.      Vision                     Perception     Praxis      Cognition   Behavior During Therapy: Flat affect Overall Cognitive Status: Difficult to assess                       Extremity/Trunk Assessment               Exercises     Shoulder Instructions       General Comments      Pertinent Vitals/ Pain       Pain Assessment: Faces Faces Pain Scale: Hurts little more Pain Location: head Pain Descriptors / Indicators: Grimacing Pain Intervention(s): Monitored during session;Repositioned;Limited activity within patient's tolerance  Home Living Family/patient expects to be discharged to:: Private residence Living Arrangements: Spouse/significant other;Children  Prior Functioning/Environment              Frequency Min 2X/week     Progress Toward Goals  OT Goals(current goals can now be found in the care plan section)  Progress towards OT goals: Progressing toward goals  Acute Rehab OT Goals Patient Stated Goal: return home with family  Plan Discharge plan needs to be updated    Co-evaluation                 End of Session Equipment Utilized During Treatment: Gait belt   Activity Tolerance Patient limited by fatigue;Patient limited by lethargy   Patient Left in chair;with call bell/phone within reach   Nurse Communication Mobility status        Time: [id number] OT Time Calculation (min): 14 min  Charges: OT General Charges $OT Visit: 1 Procedure OT  Treatments $Self Care/Home Management : 8-22 mins  Evern BioMayberry, Zahriyah Joo Lynn 08/10/2014, 1:18 PM  [phone]

## 2014-08-10 NOTE — Progress Notes (Signed)
[facility]OTOC[facility] FOR AM LAB REPLACEMENT ONLY  The patient does [facility] the [facility] Protocol based on the criteria listed below:   1. Is GFR >/= 40 ml/min? Yes.    Patient's GFR today is >90 2. Is urine output >/= 0.5 ml/kg/hr for the last 6 hours? Yes.   Patient's UOP is 0.67 ml/kg/hr 3. Is BUN < 60 mg/dL? Yes.    Patient's BUN today is 7 4. Abnormal electrolyte(s): Potassium 5. Ordered repletion with: Potassium per Protocol    Alaia Lordi P 08/10/2014 4:49 AM

## 2014-08-10 NOTE — Progress Notes (Signed)
Spoke with Dr. Jordan LikesPool with neurosurgery about tx pt to floor.  OK to tx from his standpoint.  Will continue to monitor pt.

## 2014-08-10 NOTE — Progress Notes (Signed)
Physical Therapy Treatment Patient Details Name: Wendy Delgado MRN: [id number] DOB: January 17, 1986 Today's Date: 08/10/2014    History of Present Illness Patient is a 28 y.o. [place] who presented to [facility] on 08/01/14 after being found unresponsive.  She had been complaining of a severe headache and had a generalized seizure in the ED.  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  Intubated 12/22-12/24.  Shunt placed 12/29.    PT Comments    Pt continues to be unsteady with mobility and fatigues very quickly.  Pt c/o dizziness and headache during mobility.  Still feel pt would benefit from CIR at D/C.    Follow Up Recommendations  CIR     Equipment Recommendations  Rolling walker with 5" wheels    Recommendations for Other Services Rehab consult     Precautions / Restrictions Precautions Precautions: Fall Precaution Comments: IVC pulled and pt now with a shunt Restrictions Weight Bearing Restrictions: No    Mobility  Bed Mobility Overal bed mobility: Needs Assistance Bed Mobility: Supine to Sit     Supine to sit: Min assist     General bed mobility comments: pt up in chair  Transfers Overall transfer level: Needs assistance Equipment used: 1 person hand held assist Transfers: Sit to/from Stand Sit to Stand: Min assist         General transfer comment: pt unsteady and needs UE support to maintain balance.    Ambulation/Gait Ambulation/Gait assistance: Min assist Ambulation Distance (Feet): 25 Feet (x2) Assistive device: 1 person hand held assist Gait Pattern/deviations: Step-through pattern;Decreased stride length     General Gait Details: pt continues to be unsteady and fatigues quickly.     Stairs            Wheelchair Mobility    Modified Rankin (Stroke Patients Only)        Balance Overall balance assessment: Needs assistance Sitting-balance support: No upper extremity supported;Feet supported Sitting balance-Leahy Scale: Fair Sitting balance - Comments: Unable to accept balance challenges without support   Standing balance support: No upper extremity supported;During functional activity Standing balance-Leahy Scale: Fair                      Cognition Arousal/Alertness: Lethargic Behavior During Therapy: Flat affect Overall Cognitive Status: Difficult to assess                      Exercises      General Comments        Pertinent Vitals/Pain Pain Assessment: Faces Faces Pain Scale: Hurts even more Pain Location: Head Pain Descriptors / Indicators: Grimacing;Headache Pain Intervention(s): Monitored during session;Repositioned;Patient requesting pain meds-RN notified    Home Living Family/patient expects to be discharged to:: Private residence Living Arrangements: Spouse/significant other;Children                  Prior Function            PT Goals (current goals can now be found in the care plan section) Acute Rehab PT Goals Patient Stated Goal: return home with family PT Goal Formulation: With patient Time For Goal Achievement: 08/13/14 Potential to Achieve Goals: Good Progress towards PT goals: Progressing toward goals    Frequency  Min 3X/week    PT Plan Discharge plan needs to be updated    Co-evaluation  End of Session Equipment Utilized During Treatment: Gait belt Activity Tolerance: Patient limited by fatigue Patient left: in chair;with call bell/phone within reach     Time: 1147-1208 PT Time Calculation (min) (ACUTE ONLY): 21 min  Charges:  $Gait Training: 8-22 mins                    G CodesSunny Delgado:      Wendy Delgado, [place] [phone] 08/10/2014, 3:25 PM

## 2014-08-10 NOTE — Progress Notes (Signed)
[place] TEAM 1 - Stepdown/ICU TEAM Progress Note  Wendy Delgado MVH:[id number] DOB: Jun 23, 1986 DOA: 08/01/2014 PCP: No primary care provider on file.  Admit HPI / Brief Narrative: Wendy Delgado is a 28 y.o. HF with no significant PMH. She was [facility] after her husband found her unresponsive at their home. She apparently had a severe headache earlier in the afternoon, did not seek attention then. Has hx of mild headaches in past. Reportedly while in ED, pt suffered a seizure which then led to status. She was intubated by EDP and [facility] for further evaluation. PCCM was consulted for admission.  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 Dutch QuintPoole (neurosurgery) and neurosurgery believes the risks outweigh the benefits of endoscopic cyst removal. Plan is to meet with infectious disease early next week to finalize plan (i.e. transfer to academic center for endoscopic cyst removal vs continuing medication treatment)   Neurocysticercosis  -serum T Solium antibodies by ELISA is pending -CSF for T solium antibodies by ELISA to [facility] labs pending  Status epilepticus -Secondary to Neurocysticercosis and an creased intracranial pressure. -S/P Right parietal ventriculoperitoneal shunt placement and revision from right parieto-occipital to right frontal shunt -Currently stable -Continue neuro checks q 2hr  Obstructive Hydrocephalus -See status epilepticus  Acute respiratory failure -Resolved patient on room air     Code Status: FULL Family Communication: no  family present at time of exam Disposition Plan: Per infectious disease and neurosurgery    Consultants: Dr. Judyann Munsonynthia Snider (infectious disease) Dr. Barnett AbuHenry Elsner (neurosurgery)    Procedure/Significant Events: 12/22 CT Head w/o contrast; No intracranial hemorrhage or midline shift.- Moderate hydrocephalus. 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 -2dary  obstruction of the cerebral aqueduct with obstructive hydrocephalus involving the lateral and third ventricles and transependymal flow of CSF. -suggestion additional cystic intraventricular lesions located more superiorly within the lateral ventricles bilaterally. - -This constellation of findings suggests possible neurocysticercosis.  12/29 Right parietal ventriculoperitoneal shunt 12/29 Revision of ventricular catheter placement from right parieto-occipital to right frontal   Culture -serum T Solium antibodies by ELISA is pending -CSF for T solium antibodies by ELISA to [facility] labs pending   Antibiotics: NA  DVT prophylaxis: Lovenox   Devices N/A   LINES / TUBES:     Continuous Infusions:   Objective: VITAL SIGNS: Temp: 98.2 F (36.8 C) (12/31 0748) Temp Source: Oral (12/31 0748) BP: 106/59 mmHg (12/31 0800) Pulse Rate: 61 (12/31 0800) SPO2; FIO2:   Intake/Output Summary (Last 24 hours) at 08/10/14 1058 Last data filed at 08/10/14 0200  Gross per 24 hour  Intake    700 ml  Output    550 ml  Net    150 ml     Exam: General: Patient sleepy but arousable, will answer simple questions, refuses to open her eyes but follows all commands, No acute respiratory distress Lungs: Clear to auscultation bilaterally without wheezes or crackles Cardiovascular: Bradycardic, Regular rhythm without murmur gallop or rub normal S1 and S2 Abdomen: Nontender, nondistended, soft, bowel sounds positive, no rebound, no ascites, no  appreciable mass  Extremities: No significant cyanosis, clubbing, or edema bilateral lower extremities Neurologic; cranial nerve II through XII intact, tongue/uvula midline, unable to evaluate eye-movement (patient refused open eyes) extremity strength 5/5, sensation intact throughout, did not ambulate patient  Data Reviewed: Basic Metabolic Panel:  Recent Labs Lab 08/04/14 0327 08/06/14 0605 08/10/14 0231  NA 140 142 140  K 3.4* 3.6 3.7  CL 109 109 107  CO2 22 24 26   GLUCOSE 81 103* 95  BUN <5* 7 7  CREATININE 0.45* 0.65 0.55  CALCIUM 8.4 9.1 9.0   Liver Function Tests: No results for input(s): AST, ALT, ALKPHOS, BILITOT, PROT, ALBUMIN in the last 168 hours. No results for input(s): LIPASE, AMYLASE in the last 168 hours. No results for input(s): AMMONIA in the last 168 hours. CBC:  Recent Labs Lab 08/04/14 0327 08/10/14 0231  WBC 10.2 9.3  HGB 11.5* 13.2  HCT 35.1* 40.4  MCV 95.4 94.4  PLT 214 287   Cardiac Enzymes: No results for input(s): CKTOTAL, CKMB, CKMBINDEX, TROPONINI in the last 168 hours. BNP (last 3 results) No results for input(s): PROBNP in the last 8760 hours. CBG: No results for input(s): GLUCAP in the last 168 hours.  Recent Results (from the past 240 hour(s))  Urine culture     Status: None   Collection Time: 08/01/14  9:40 PM  Result Value Ref Range Status   Specimen Description URINE, CATHETERIZED  Final   Special Requests NONE  Final   Culture  Setup Time   Final    08/02/2014 14:10 Performed at [facility]    Colony Count NO GROWTH Performed at [facility]   Final   Culture NO GROWTH Performed at [facility]   Final   Report Status 08/03/2014 FINAL  Final  MRSA PCR Screening     Status: None   Collection Time: 08/02/14 12:25 AM  Result Value Ref Range Status   MRSA by PCR NEGATIVE NEGATIVE Final    Comment:        The GeneXpert MRSA Assay (FDA approved for NASAL specimens only), is one component of  a comprehensive MRSA colonization surveillance program. It is not intended to diagnose MRSA infection nor to guide or monitor treatment for MRSA infections.      Studies:  Recent x-ray studies have been reviewed in detail by the Attending Physician  Scheduled Meds:  Scheduled Meds: . enoxaparin (LOVENOX) injection  50 mg Subcutaneous Daily  . feeding supplement (ENSURE COMPLETE)  237 mL Oral BID BM  . feeding supplement (PRO-STAT SUGAR FREE 64)  30 mL Oral Q1500  . potassium chloride  20 mEq Oral Q4H    Time spent on care of this patient: 40 mins   Drema DallasWOODS, CURTIS, J , MD   [facility] Office  [phone] Pager [phone]  On-Call/Text Page:      [email or web address] [email or web address]      password [email or web address]  If 7PM-7AM, please contact night-coverage [email or web address] Password [email or web address] 08/10/2014, 10:58 AM   LOS: 9 days

## 2014-08-10 NOTE — Progress Notes (Signed)
Spoke with Dr. Joseph ArtWoods about Wendy Delgado having no IV access.  Order to place a PICC line.  Spoke with IV team after order was placed and IV team stated that there is no PICC RN here tonight and the procedure will not be until tomorrow.  Made Dr. Joseph ArtWoods aware of this.  Decadron switched from IV to PO.  Dr. Joseph ArtWoods stated OK to hold off on PICC for now and to not have IV access at this time as long as Wendy Delgado is tolerating medications and food and fluids by mouth.  Also spoke with Dr. Joseph ArtWoods about Wendy Delgado's sister-in-law [name] wanting to speak with M.D. About Wendy Delgado's condition.  Gave him the number to call.  OK to tx to stepdown.  Wendy Delgado stable at this time.  Will continue to monitor Wendy Delgado.

## 2014-08-11 DIAGNOSIS — G911 Obstructive hydrocephalus: Principal | ICD-10-CM

## 2014-08-11 DIAGNOSIS — G40501 Epileptic seizures related to external causes, not intractable, with status epilepticus: Secondary | ICD-10-CM | POA: Insufficient documentation

## 2014-08-11 DIAGNOSIS — J9690 Respiratory failure, unspecified, unspecified whether with hypoxia or hypercapnia: Secondary | ICD-10-CM

## 2014-08-11 LAB — CBC WITH DIFFERENTIAL/PLATELET
BASOS ABS: 0 10*3/uL (ref 0.0–0.1)
BASOS PCT: 0 % (ref 0–1)
EOS PCT: 0 % (ref 0–5)
Eosinophils Absolute: 0 10*3/uL (ref 0.0–0.7)
HEMATOCRIT: 44.4 % (ref 36.0–46.0)
HEMOGLOBIN: 14.9 g/dL (ref 12.0–15.0)
LYMPHS ABS: 1.1 10*3/uL (ref 0.7–4.0)
Lymphocytes Relative: 11 % — ABNORMAL LOW (ref 12–46)
MCH: 32.4 pg (ref 26.0–34.0)
MCHC: 33.6 g/dL (ref 30.0–36.0)
MCV: 96.5 fL (ref 78.0–100.0)
MONO ABS: 0.1 10*3/uL (ref 0.1–1.0)
Monocytes Relative: 1 % — ABNORMAL LOW (ref 3–12)
Neutro Abs: 8.9 10*3/uL — ABNORMAL HIGH (ref 1.7–7.7)
Neutrophils Relative %: 88 % — ABNORMAL HIGH (ref 43–77)
Platelets: 310 10*3/uL (ref 150–400)
RBC: 4.6 MIL/uL (ref 3.87–5.11)
RDW: 12.5 % (ref 11.5–15.5)
WBC: 10.2 10*3/uL (ref 4.0–10.5)

## 2014-08-11 LAB — MAGNESIUM: MAGNESIUM: 2.4 mg/dL (ref 1.5–2.5)

## 2014-08-11 LAB — COMPREHENSIVE METABOLIC PANEL
ALT: 31 U/L (ref 0–35)
ANION GAP: 12 (ref 5–15)
AST: 24 U/L (ref 0–37)
Albumin: 3.7 g/dL (ref 3.5–5.2)
Alkaline Phosphatase: 115 U/L (ref 39–117)
BUN: 9 mg/dL (ref 6–23)
CALCIUM: 9.6 mg/dL (ref 8.4–10.5)
CHLORIDE: 108 meq/L (ref 96–112)
CO2: 20 mmol/L (ref 19–32)
CREATININE: 0.5 mg/dL (ref 0.50–1.10)
GFR calc Af Amer: >90 mL/min (ref >90)
GLUCOSE: 115 mg/dL — AB (ref 70–99)
Potassium: 4.8 mmol/L (ref 3.5–5.1)
Sodium: 140 mmol/L (ref 135–145)
Total Bilirubin: 0.9 mg/dL (ref 0.3–1.2)
Total Protein: 7.5 g/dL (ref 6.0–8.3)

## 2014-08-11 MED ORDER — INFLUENZA VAC SPLIT QUAD 0.5 ML IM SUSY: 0.5000 mL | PREFILLED_SYRINGE | INTRAMUSCULAR | Status: DC | PRN

## 2014-08-11 NOTE — Progress Notes (Signed)
Patient ID: Wendy Delgado, female   DOB: Aug 27, 1985, 29 y.o.   MRN: [id number] Subjective:  The patient is alert and pleasant. She is in no apparent distress.  Objective: Vital signs in last 24 hours: Temp:  [97.9 F (36.6 C)-98.8 F (37.1 C)] 98.1 F (36.7 C) (01/01 0743) Pulse Rate:  [48-66] 50 (01/01 0743) Resp:  [10-18] 13 (01/01 0743) BP: (86-114)/(38-70) 103/56 mmHg (01/01 0743) SpO2:  [97 %-100 %] 98 % (01/01 0743) Weight:  [85.4 kg (188 lb 4.4 oz)] 85.4 kg (188 lb 4.4 oz) (12/31 2000)  Intake/Output from previous day: 12/31 0701 - 01/01 0700 In: 240 [P.O.:240] Out: 550 [Urine:550] Intake/Output this shift:    Physical exam the patient is alert and pleasant. Her pupils are equal. She is moving all 4 extremities well. Her incisions are healing well without signs of infection.  Lab Results:  Recent Labs  08/10/14 0231 08/11/14 0251  WBC 9.3 10.2  HGB 13.2 14.9  HCT 40.4 44.4  PLT 287 310   BMET  Recent Labs  08/10/14 0231 08/11/14 0251  NA 140 140  K 3.7 4.8  CL 107 108  CO2 26 20  GLUCOSE 95 115*  BUN 7 9  CREATININE 0.55 0.50  CALCIUM 9.0 9.6    Studies/Results: No results found.  Assessment/Plan: Status post ventriculoperitoneal shunt: The patient neurologically stable. Please have her follow-up with Dr. Danielle Dess after discharge for staple removal in about a week.  LOS: 10 days     Andreas Sobolewski D 08/11/2014, 9:11 AM

## 2014-08-11 NOTE — Progress Notes (Signed)
[facility]  ID: Wendy Delgado is a 29 year old Hispanic female who has cysticercosis with obstructive hydrocephalus. She had an intraventricular catheter placed initially which seemed to work well. A right parietal ventriculoperitoneal shunt was placed this morning, however when the patient failed to wake up adequately was noted that her catheter was placed through the atrium of the lateral ventricle into the region of the thalamus. It is not draining properly. She is taken back to the OR on 12/29 to undergo revision of ventricular catheter placement which will be placed into the right frontal region.   Subjective: Afebrile, denies headache, starting to open eyes somewhat with less pain.   Sister at her bedside, who speaks english and translated some questions/answers   Antibiotics:  Anti-infectives    Start     Dose/Rate Route Frequency Ordered Stop   08/09/14 0000  ceFAZolin (ANCEF) IVPB 1 g/50 mL premix     1 g100 mL/hr over 30 Minutes Intravenous Every 8 hours 08/08/14 1810 08/09/14 0855   08/08/14 1703  bacitracin 50,000 Units in sodium chloride irrigation 0.9 % 500 mL irrigation  Status:  Discontinued       As needed 08/08/14 1703 08/08/14 1719   08/08/14 0845  bacitracin 50,000 Units in sodium chloride irrigation 0.9 % 500 mL irrigation  Status:  Discontinued       As needed 08/08/14 0917 08/08/14 1029      Medications: Scheduled Meds: . dexamethasone  4 mg Oral 3 times per day  . enoxaparin (LOVENOX) injection  50 mg Subcutaneous Daily  . feeding supplement (ENSURE COMPLETE)  237 mL Oral BID BM  . feeding supplement (PRO-STAT SUGAR FREE 64)  30 mL Oral Q1500    Objective: Weight change: -2 lb 6.8 oz (-1.1 kg)  Intake/Output Summary (Last 24 hours) at 08/11/14 1346 Last data filed at 08/11/14 0940  Gross per 24 hour  Intake    260 ml  Output    550 ml  Net   -290 ml   BP 125/59 mmHg  Pulse 61  Temp(Src) 98.4 F (36.9 C) (Oral)   Resp 19  Ht  (1.626 m)  Wt 188 lb 4.4 oz (85.4 kg)  BMI 32.30 kg/m2  SpO2 97%  LMP 07/08/2014 (Approximate)  Breastfeeding? No Physical Exam  Constitutional:  oriented to person, place, and time. appears well-developed and well-nourished. No distress. Right side of head shaved with staples in place from VP shunt placement HENT:  Mouth/Throat: Oropharynx is clear and moist. No oropharyngeal exudate.  Cardiovascular: Normal rate, regular rhythm and normal heart sounds. Exam reveals no gallop and no friction rub.  No murmur heard.  Pulmonary/Chest: Effort normal and breath sounds normal. No respiratory distress.  has no wheezes.  Neurological: alert and oriented to person, place, and time.  Skin: Skin is warm and dry. No rash noted. No erythema.  Psychiatric: a normal mood and affect.  behavior is normal.   CBC:  CBC Latest Ref Rng 08/11/2014 08/10/2014 08/04/2014  WBC 4.0 - 10.5 K/uL 10.2 9.3 10.2  Hemoglobin 12.0 - 15.0 g/dL 16.1 09.6 11.5(L)  Hematocrit 36.0 - 46.0 % 44.4 40.4 35.1(L)  Platelets 150 - 400 K/uL 310 287 214    Micro Results: Recent Results (from the past 720 hour(s))  Urine culture     Status: None   Collection Time: 08/01/14  9:40 PM  Result Value Ref Range Status   Specimen Description URINE, CATHETERIZED  Final   Special Requests  NONE  Final   Culture  Setup Time   Final    08/02/2014 14:10 Performed at [facility] Count NO GROWTH Performed at [facility]   Final   Culture NO GROWTH Performed at [facility]   Final   Report Status 08/03/2014 FINAL  Final  MRSA PCR Screening     Status: None   Collection Time: 08/02/14 12:25 AM  Result Value Ref Range Status   MRSA by PCR NEGATIVE NEGATIVE Final    Comment:        The GeneXpert MRSA Assay (FDA approved for NASAL specimens only), is one component of a comprehensive MRSA colonization surveillance program. It is not intended to diagnose MRSA infection nor to  guide or monitor treatment for MRSA infections.     Studies/Results: No results found.  hiv negative quantiferon negative strongy ab negative NCC ab IGG negative   Assessment/Plan:  Active Problems:   Status epilepticus   Acute encephalopathy   Hydrocephalus   Neurocysticercosis   Obstructive hydrocephalus   Respiratory failure    Jetty Siedlecki is a 29 y.o. female with presumed Neurocysticercosis with intraventricular but no obvious parenchymal lesions, with hydrocephalus secondary to cysts in 3rd ventricle sp IV drain placment  #1 presumed  Intraventricular Neurocysticercosis: clinical presentation consistent with Neurocysticercosis. serum T Solium antibodies by ELISA is negative but CSF for T solium antibodies by ELISA to [facility] labs is pending  Recommend getting 2nd opinion from academic center that is familiar with endoscopic removal of  Intraventricular cysts, since it is the mainstay of treatment for this presentation. Initiation of anti-helminthic treatment should wait until discussion with neurosurgery.   She currently started steroids and maybe having some improvement with photophobia.  Will talk to lab about sending blood to state lab for NCC rather than commercial labs since sometimes there is discrepancy in lab results.  Recommend ophtho eval at academic center to exclude eye involvement since treatment initiation could also cause worsening inflammation if eye involvement, including blindness   Caelie Remsburg B. Drue Second MD MPH [facility] [phone]     LOS: 10 days   Judyann Munson 08/11/2014, 1:46 PM

## 2014-08-11 NOTE — Progress Notes (Signed)
[place] TEAM 1 - Stepdown/ICU TEAM Progress Note  Wendy Delgado WUJ:[id number] DOB: 05-25-1986 DOA: 12/2Chistina RostonNo primary care provider on file.  Admit HPI / Brief Narrative: Wendy Delgado is a 29 y.o. HF with no significant PMH. She was taken to [facility] on 12/22 after her husband found her unresponsive at their home. She apparently had a severe headache earlier in the afternoon, did not seek attention then. Has hx of mild headaches in past. Reportedly while in ED, pt suffered a seizure which then led to status. She was intubated by EDP and transferred to [facility] neuro ICU for further evaluation. PCCM was consulted for admission.  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 Dutch Quint (neurosurgery) and neurosurgery believes the risks outweigh the benefits of endoscopic cyst removal. Plan is for neurosurgery to meet with infectious disease early next week to finalize plan (i.e. transfer to academic center for endoscopic cyst removal vs continuing medication treatment)   Neurocysticercosis  -serum T Solium antibodies by ELISA is pending -CSF for T solium antibodies by ELISA to [facility] labs pending -Continue Decadron 4 mg TID -Spoke with Dr. Judyann Munson (ID) : Albendazole Is NOT TO BE STARTED  Status epilepticus -Secondary to Neurocysticercosis and an creased intracranial pressure. -S/P Right parietal ventriculoperitoneal shunt placement and revision from right parieto-occipital to right frontal shunt -Currently  stable -Continue neuro checks q 2hr  Obstructive Hydrocephalus -See status epilepticus  Acute respiratory failure -Resolved patient on room air     Code Status: FULL Family Communication: no family present at time of exam Disposition Plan: Per infectious disease and neurosurgery    Consultants: Dr. Judyann Munson (infectious disease) Dr. Barnett Abu (neurosurgery)    Procedure/Significant Events: 12/22 CT Head w/o contrast; No intracranial hemorrhage or midline shift.- Moderate hydrocephalus. 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 -2dary  obstruction of the cerebral aqueduct with obstructive hydrocephalus involving the lateral and third ventricles and transependymal flow of CSF. -suggestion additional cystic intraventricular lesions located more superiorly within the lateral ventricles bilaterally. - -This constellation of findings suggests possible neurocysticercosis.  12/29 Right parietal ventriculoperitoneal shunt 12/29 Revision of ventricular catheter placement from right parieto-occipital to right frontal   Culture -serum T Solium antibodies by ELISA is pending -CSF for T solium antibodies by ELISA to [facility] labs pending   Antibiotics: NA  DVT prophylaxis: Lovenox   Devices N/A   LINES / TUBES:     Continuous Infusions:   Objective: VITAL SIGNS: Temp: 98.4 F (36.9 C) (01/01 1132) Temp Source: Oral (01/01 1132) BP: 125/59 mmHg (01/01 1132) Pulse Rate: 61 (01/01 1132) SPO2; FIO2:   Intake/Output Summary (Last 24 hours) at 08/11/14 1421 Last data filed at 08/11/14 0940  Gross per 24 hour  Intake    260 ml  Output    550 ml  Net   -290 ml     Exam: General: Patient sleepy but arousable, will answer simple questions, follows all commands, No acute respiratory distress Lungs: Clear to auscultation bilaterally without wheezes or  crackles Cardiovascular: Bradycardic, Regular  rhythm without murmur gallop or rub normal S1 and S2 Abdomen: Nontender, nondistended, soft, bowel sounds positive, no rebound, no ascites, no appreciable mass Extremities: No significant cyanosis, clubbing, or edema bilateral lower extremities Neurologic; cranial nerve II through XII intact, tongue/uvula midline, unable to evaluate eye-movement (patient refused open eyes) extremity strength 5/5, sensation intact throughout, bilateral finger -nose -finger within normal limit, did not ambulate patient  Data Reviewed: Basic Metabolic Panel:  Recent Labs Lab 08/06/14 0605 08/10/14 0231 08/11/14 0251  NA 142 140 140  K 3.6 3.7 4.8  CL 109 107 108  CO2 GLUCOSE 103* 95 115*  BUN CREATININE 0.65 0.55 0.50  CALCIUM 9.1 9.0 9.6  MG  --   --  2.4   Liver Function Tests:  Recent Labs Lab 08/11/14 0251  AST 24  ALT 31  ALKPHOS 115  BILITOT 0.9  PROT 7.5  ALBUMIN 3.7   No results for input(s): LIPASE, AMYLASE in the last 168 hours. No results for input(s): AMMONIA in the last 168 hours. CBC:  Recent Labs Lab 08/10/14 0231 08/11/14 0251  WBC 9.3 10.2  NEUTROABS  --  8.9*  HGB 13.2 14.9  HCT 40.4 44.4  MCV 94.4 96.5  PLT 287 310   Cardiac Enzymes: No results for input(s): CKTOTAL, CKMB, CKMBINDEX, TROPONINI in the last 168 hours. BNP (last 3 results) No results for input(s): PROBNP in the last 8760 hours. CBG: No results for input(s): GLUCAP in the last 168 hours.  Recent Results (from the past 240 hour(s))  Urine culture     Status: None   Collection Time: 08/01/14  9:40 PM  Result Value Ref Range Status   Specimen Description URINE, CATHETERIZED  Final   Special Requests NONE  Final   Culture  Setup Time   Final    08/02/2014 14:10 Performed at [facility]    Colony Count NO GROWTH Performed at [facility]   Final   Culture NO GROWTH Performed at [facility]   Final    Report Status 08/03/2014 FINAL  Final  MRSA PCR Screening     Status: None   Collection Time: 08/02/14 12:25 AM  Result Value Ref Range Status   MRSA by PCR NEGATIVE NEGATIVE Final    Comment:        The GeneXpert MRSA Assay (FDA approved for NASAL specimens only), is one component of a comprehensive MRSA colonization surveillance program. It is not intended to diagnose MRSA infection nor to guide or monitor treatment for MRSA infections.      Studies:  Recent x-ray studies have been reviewed in detail by the Attending Physician  Scheduled Meds:  Scheduled Meds: . dexamethasone  4 mg Oral 3 times per day  . enoxaparin (LOVENOX) injection  50 mg Subcutaneous Daily  . feeding supplement (ENSURE COMPLETE)  237 mL Oral BID BM  . feeding supplement (PRO-STAT SUGAR FREE 64)  30 mL Oral Q1500    Time spent on care of this patient: 40 mins   Drema Dallas , MD   [facility] Office  [phone] Pager [phone]  On-Call/Text Page:      [email or web address]      password [email or web address]  If 7PM-7AM, please contact night-coverage [email or web address] Password [email or web address] 08/11/2014, 2:21 PM   LOS: 10 days

## 2014-08-12 DIAGNOSIS — R410 Disorientation, unspecified: Secondary | ICD-10-CM

## 2014-08-12 LAB — CBC WITH DIFFERENTIAL/PLATELET
BASOS ABS: 0 10*3/uL (ref 0.0–0.1)
Basophils Relative: 0 % (ref 0–1)
Eosinophils Absolute: 0 10*3/uL (ref 0.0–0.7)
Eosinophils Relative: 0 % (ref 0–5)
HCT: 43.9 % (ref 36.0–46.0)
HEMOGLOBIN: 14.9 g/dL (ref 12.0–15.0)
LYMPHS ABS: 1.7 10*3/uL (ref 0.7–4.0)
Lymphocytes Relative: 16 % (ref 12–46)
MCH: 32.4 pg (ref 26.0–34.0)
MCHC: 33.9 g/dL (ref 30.0–36.0)
MCV: 95.4 fL (ref 78.0–100.0)
MONO ABS: 0.3 10*3/uL (ref 0.1–1.0)
Monocytes Relative: 3 % (ref 3–12)
NEUTROS PCT: 81 % — AB (ref 43–77)
Neutro Abs: 9 10*3/uL — ABNORMAL HIGH (ref 1.7–7.7)
PLATELETS: 384 10*3/uL (ref 150–400)
RBC: 4.6 MIL/uL (ref 3.87–5.11)
RDW: 12.3 % (ref 11.5–15.5)
WBC: 11 10*3/uL — AB (ref 4.0–10.5)

## 2014-08-12 LAB — COMPREHENSIVE METABOLIC PANEL
ALBUMIN: 3.7 g/dL (ref 3.5–5.2)
ALK PHOS: 109 U/L (ref 39–117)
ALT: 31 U/L (ref 0–35)
ANION GAP: 9 (ref 5–15)
AST: 19 U/L (ref 0–37)
BILIRUBIN TOTAL: 0.4 mg/dL (ref 0.3–1.2)
BUN: 11 mg/dL (ref 6–23)
CALCIUM: 9.5 mg/dL (ref 8.4–10.5)
CHLORIDE: 106 meq/L (ref 96–112)
CO2: 26 mmol/L (ref 19–32)
CREATININE: 0.61 mg/dL (ref 0.50–1.10)
GFR calc Af Amer: >90 mL/min (ref >90)
Glucose, Bld: 140 mg/dL — ABNORMAL HIGH (ref 70–99)
POTASSIUM: 4 mmol/L (ref 3.5–5.1)
SODIUM: 141 mmol/L (ref 135–145)
Total Protein: 7.3 g/dL (ref 6.0–8.3)

## 2014-08-12 LAB — MAGNESIUM: MAGNESIUM: 2.4 mg/dL (ref 1.5–2.5)

## 2014-08-12 NOTE — Progress Notes (Signed)
[place] TEAM 1 - Stepdown/ICU TEAM Progress Note  Amica Harron WUJ:[id number] DOB: [birth year] DOA: 08/01/2014 PCP: No primary care provider on file.  Admit HPI / Brief Narrative: Wendy Delgado is a 29 y.o. HF with no significant PMH. She was taken to [facility] on 12/22 after her husband found her unresponsive at their home. She apparently had a severe headache earlier in the afternoon, did not seek attention then. Has hx of mild headaches in past. Reportedly while in ED, pt suffered a seizure which then led to status. She was intubated by EDP and transferred to [facility] neuro ICU for further evaluation. PCCM was consulted for admission.  HPI/Subjective: 1/2 much more awake and interactive, A/O 4, patient agreed to move from bed into chair during the day. states negative headache, negative N/V, negative CP, negative SOB.   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 Dutch Quint (neurosurgery) and neurosurgery believes the risks outweigh the benefits of endoscopic cyst removal. Plan is for neurosurgery to meet with infectious disease early next week to finalize plan (i.e. [facility] vs continuing medication treatment)   Neurocysticercosis  -serum T Solium antibodies by ELISA is pending -CSF for T solium antibodies by ELISA to [facility] labs pending -Continue Decadron 4 mg TID -Spoke with Dr. Judyann Munson (ID) : Albendazole Is NOT TO BE STARTED  Status epilepticus -Secondary to Neurocysticercosis and an creased intracranial pressure. -S/P Right parietal ventriculoperitoneal shunt placement and revision from right parieto-occipital to right frontal  shunt -Currently stable -Continue neuro checks q 4hr  Obstructive Hydrocephalus -See status epilepticus  Acute respiratory failure -Resolved patient on room air     Code Status: FULL Family Communication: no family present at time of exam Disposition Plan: Per infectious disease and neurosurgery    Consultants: Dr. Judyann Munson (infectious disease) Dr. Barnett Abu (neurosurgery)    Procedure/Significant Events: 12/22 CT Head w/o contrast; No intracranial hemorrhage or midline shift.- Moderate hydrocephalus. 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 -2dary  obstruction of the cerebral aqueduct with obstructive hydrocephalus involving the lateral and third ventricles and transependymal flow of CSF. -suggestion additional cystic intraventricular lesions located more superiorly within the lateral ventricles bilaterally. - -This constellation of findings suggests possible neurocysticercosis.  12/29 Right parietal ventriculoperitoneal shunt 12/29 Revision of ventricular catheter placement from right parieto-occipital to right frontal   Culture -12/27 QuantiFERON negative -12/27 HIV 1 and 2 negative -12/27 Strongyloides AB negative -serum T Solium antibodies by ELISA is pending -CSF for T solium antibodies by ELISA to [facility] labs pending   Antibiotics: NA  DVT prophylaxis: Lovenox   Devices N/A   LINES / TUBES:     Continuous Infusions:   Objective: VITAL SIGNS: Temp: 98.3 F (36.8 C) (01/02 1100) Temp Source: Oral (01/02 1100) BP: 103/75 mmHg (01/02 1132) Pulse Rate: 51 (01/02 1133) SPO2; FIO2:   Intake/Output Summary (Last 24 hours) at 08/12/14 1517 Last data filed at 08/12/14 0828  Gross per 24 hour  Intake    240 ml  Output      0 ml  Net    240 ml     Exam: General: A/O 4, answers  all questions, follows all commands, much more alert today Lungs:  Clear to auscultation bilaterally without wheezes or crackles Cardiovascular: Bradycardic, Regular rhythm without murmur gallop or rub normal S1 and S2 Abdomen: Nontender, nondistended, soft, bowel sounds positive, no rebound, no ascites, no appreciable mass Extremities: No significant cyanosis, clubbing, or edema bilateral lower extremities Neurologic; cranial nerve II through XII intact, tongue/uvula midline, unable to evaluate eye-movement (patient refused open eyes) extremity strength 5/5, sensation intact throughout,   Data Reviewed: Basic Metabolic Panel:  Recent Labs Lab 08/06/14 0605 08/10/14 0231 08/11/14 0251 08/12/14 0230  NA 142 140 140 141  K 3.6 3.7 4.8 4.0  CL 109 107 108 106  CO2 GLUCOSE 103* 95 115* 140*  BUN CREATININE 0.65 0.55 0.50 0.61  CALCIUM 9.1 9.0 9.6 9.5  MG  --   --  2.4 2.4   Liver Function Tests:  Recent Labs Lab 08/11/14 0251 08/12/14 0230  AST 24 19  ALT 31 31  ALKPHOS 115 109  BILITOT 0.9 0.4  PROT 7.5 7.3  ALBUMIN 3.7 3.7   No results for input(s): LIPASE, AMYLASE in the last 168 hours. No results for input(s): AMMONIA in the last 168 hours. CBC:  Recent Labs Lab 08/10/14 0231 08/11/14 0251 08/12/14 0230  WBC 9.3 10.2 11.0*  NEUTROABS  --  8.9* 9.0*  HGB 13.2 14.9 14.9  HCT 40.4 44.4 43.9  MCV 94.4 96.5 95.4  PLT 287 310 384   Cardiac Enzymes: No results for input(s): CKTOTAL, CKMB, CKMBINDEX, TROPONINI in the last 168 hours. BNP (last 3 results) No results for input(s): PROBNP in the last 8760 hours. CBG: No results for input(s): GLUCAP in the last 168 hours.  No results found for this or any previous visit (from the past 240 hour(s)).   Studies:  Recent x-ray studies have been reviewed in detail by the Attending Physician  Scheduled Meds:  Scheduled Meds: . dexamethasone  4 mg Oral 3 times per day  . enoxaparin (LOVENOX) injection  50 mg Subcutaneous Daily  . feeding supplement (ENSURE  COMPLETE)  237 mL Oral BID BM  . feeding supplement (PRO-STAT SUGAR FREE 64)  30 mL Oral Q1500    Time spent on care of this patient: 40 mins   Drema Dallas , MD   [facility] Office  [phone] Pager [phone]  On-Call/Text Page:      [email or web address]      password [email or web address]  If 7PM-7AM, please contact night-coverage [email or web address] Password [email or web address] 08/12/2014, 3:17 PM   LOS: 11 days

## 2014-08-13 LAB — CBC WITH DIFFERENTIAL/PLATELET
Basophils Absolute: 0 10*3/uL (ref 0.0–0.1)
Basophils Relative: 0 % (ref 0–1)
EOS PCT: 0 % (ref 0–5)
Eosinophils Absolute: 0 10*3/uL (ref 0.0–0.7)
HCT: 44 % (ref 36.0–46.0)
Hemoglobin: 15 g/dL (ref 12.0–15.0)
Lymphocytes Relative: 16 % (ref 12–46)
Lymphs Abs: 1.7 10*3/uL (ref 0.7–4.0)
MCH: 32.2 pg (ref 26.0–34.0)
MCHC: 34.1 g/dL (ref 30.0–36.0)
MCV: 94.4 fL (ref 78.0–100.0)
MONO ABS: 0.3 10*3/uL (ref 0.1–1.0)
Monocytes Relative: 3 % (ref 3–12)
NEUTROS ABS: 9 10*3/uL — AB (ref 1.7–7.7)
Neutrophils Relative %: 81 % — ABNORMAL HIGH (ref 43–77)
Platelets: 381 10*3/uL (ref 150–400)
RBC: 4.66 MIL/uL (ref 3.87–5.11)
RDW: 12.3 % (ref 11.5–15.5)
WBC: 11.1 10*3/uL — AB (ref 4.0–10.5)

## 2014-08-13 LAB — COMPREHENSIVE METABOLIC PANEL
ALT: 32 U/L (ref 0–35)
AST: 20 U/L (ref 0–37)
Albumin: 3.6 g/dL (ref 3.5–5.2)
Alkaline Phosphatase: 107 U/L (ref 39–117)
Anion gap: 7 (ref 5–15)
BUN: 12 mg/dL (ref 6–23)
CHLORIDE: 107 meq/L (ref 96–112)
CO2: 25 mmol/L (ref 19–32)
Calcium: 9.4 mg/dL (ref 8.4–10.5)
Creatinine, Ser: 0.64 mg/dL (ref 0.50–1.10)
GFR calc Af Amer: >90 mL/min (ref >90)
GFR calc non Af Amer: >90 mL/min (ref >90)
GLUCOSE: 139 mg/dL — AB (ref 70–99)
Potassium: 4 mmol/L (ref 3.5–5.1)
SODIUM: 139 mmol/L (ref 135–145)
Total Bilirubin: 0.3 mg/dL (ref 0.3–1.2)
Total Protein: 7.7 g/dL (ref 6.0–8.3)

## 2014-08-13 LAB — MAGNESIUM: MAGNESIUM: 2.3 mg/dL (ref 1.5–2.5)

## 2014-08-13 MED ORDER — ENOXAPARIN SODIUM 40 MG/0.4ML ~~LOC~~ SOLN
40.0000 mg | Freq: Every day | SUBCUTANEOUS | Status: DC
  Administered 2014-08-13 – 2014-08-15 (×3): 40 mg via SUBCUTANEOUS
  Filled 2014-08-13 (×3): qty 0.4

## 2014-08-13 NOTE — Progress Notes (Signed)
Wendy Delgado ZOX:[id number] DOB: [birth year] DOA: 08/01/2014 PCP: No primary care provider on file.  Admit HPI / Brief Narrative: Wendy Delgado is a 29 y.o. HF with no significant PMH. She was taken to [facility] on 12/22 after her husband found her unresponsive at their home. She apparently had a severe headache earlier in the afternoon, did not seek attention then. Has hx of mild headaches in past. Reportedly while in ED, pt suffered a seizure which then led to status. She was intubated by EDP and transferred to [facility] neuro ICU for further evaluation. PCCM was consulted for admission.  HPI/Subjective: 1/3 A/O 4, sitting in chair eating, and playing with her daughter. Request to have her diet advanced to regular diet (would like family to bring food in from home). Has related around ward without symptoms. States negative headache, negative N/V, negative CP, negative SOB.   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 Dutch Quint (neurosurgery) and neurosurgery believes the risks outweigh the benefits of endoscopic cyst removal. Plan is for neurosurgery to meet with infectious disease early next week to finalize plan (i.e. transfer to academic center for endoscopic cyst removal vs continuing medication treatment)   Neurocysticercosis  -serum T Solium antibodies by ELISA is pending -CSF for T solium antibodies by ELISA to [facility] labs pending -Continue Decadron 4 mg TID -Spoke with Dr. Judyann Munson (ID) : Albendazole Is NOT TO BE STARTED  Status epilepticus -Secondary to Neurocysticercosis and an creased intracranial pressure. -S/P Right  parietal ventriculoperitoneal shunt placement and revision from right parieto-occipital to right frontal shunt -Currently stable -DC neuro checks   Obstructive Hydrocephalus -See status epilepticus  Acute respiratory failure -Resolved patient on room air     Code Status: FULL Family Communication: family present at time of exam Disposition Plan: Per infectious disease and neurosurgery    Consultants: Dr. Judyann Munson (infectious disease) Dr. Barnett Abu (neurosurgery)    Procedure/Significant Events: 12/22 CT Head w/o contrast; No intracranial hemorrhage or midline shift.- Moderate hydrocephalus. 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 -2dary  obstruction of the cerebral aqueduct with obstructive hydrocephalus involving the lateral and third ventricles and transependymal flow of CSF. -suggestion additional cystic intraventricular lesions located more superiorly within the lateral ventricles bilaterally. - -This constellation of findings suggests possible neurocysticercosis.  12/29 Right parietal ventriculoperitoneal shunt 12/29 Revision of ventricular catheter placement from right parieto-occipital to right frontal   Culture -12/27 QuantiFERON negative -12/27 HIV 1 and 2 negative -12/27 Strongyloides AB negative -serum T Solium antibodies by ELISA is pending -CSF for T solium antibodies by ELISA to [facility] labs pending   Antibiotics: NA  DVT prophylaxis: Lovenox   Devices N/A   LINES / TUBES:     Continuous Infusions:   Objective: VITAL SIGNS: Temp: 98 F (36.7 C) (01/03 1608) Temp Source: Oral (01/03 1608) BP: 115/57 mmHg (01/03 1600) Pulse Rate: 72 (01/03 1601) SPO2; FIO2:   Intake/Output Summary (Last 24 hours) at 08/13/14 1845 Last data filed at 08/13/14 1800  Gross per 24 hour  Intake    777 ml  Output      0 ml  Net    777 ml      Exam: General: A/O 4, answers all questions, follows all commands, request to be allowed home food today  Lungs: Clear to auscultation bilaterally without wheezes or crackles Cardiovascular: Bradycardic, Regular rhythm without murmur gallop or rub normal S1 and S2 Abdomen: Nontender, nondistended, soft, bowel sounds positive, no rebound, no ascites, no appreciable mass Extremities: No significant cyanosis, clubbing, or edema bilateral lower extremities Neurologic; cranial nerve II through XII intact, tongue/uvula midline, unable to evaluate eye-movement (patient refused open eyes) extremity strength 5/5, sensation intact throughout,   Data Reviewed: Basic Metabolic Panel:  Recent Labs Lab 08/10/14 0231 08/11/14 0251 08/12/14 0230 08/13/14 0225  NA 140 140 141 139  K 3.7 4.8 4.0 4.0  CL 107 108 106 107  CO2 GLUCOSE 95 115* 140* 139*  BUN CREATININE 0.55 0.50 0.61 0.64  CALCIUM 9.0 9.6 9.5 9.4  MG  --  2.4 2.4 2.3   Liver Function Tests:  Recent Labs Lab 08/11/14 0251 08/12/14 0230 08/13/14 0225  AST ALT 31 31 32  ALKPHOS 115 109 107  BILITOT 0.9 0.4 0.3  PROT 7.5 7.3 7.7  ALBUMIN 3.7 3.7 3.6   No results for input(s): LIPASE, AMYLASE in the last 168 hours. No results for input(s): AMMONIA in the last 168 hours. CBC:  Recent Labs Lab 08/10/14 0231 08/11/14 0251 08/12/14 0230 08/13/14 0225  WBC 9.3 10.2 11.0* 11.1*  NEUTROABS  --  8.9* 9.0* 9.0*  HGB 13.2 14.9 14.9 15.0  HCT 40.4 44.4 43.9 44.0  MCV 94.4 96.5 95.4 94.4  PLT 287 310 384 381   Cardiac Enzymes: No results for input(s): CKTOTAL, CKMB, CKMBINDEX, TROPONINI in the last 168 hours. BNP (last 3 results) No results for input(s): PROBNP in the last 8760 hours. CBG: No results for input(s): GLUCAP in the last 168 hours.  No results found for this or any previous visit (from the past 240 hour(s)).   Studies:  Recent x-ray studies have been reviewed in  detail by the Attending Physician  Scheduled Meds:  Scheduled Meds: . dexamethasone  4 mg Oral 3 times per day  . enoxaparin (LOVENOX) injection  40 mg Subcutaneous Daily  . feeding supplement (ENSURE COMPLETE)  237 mL Oral BID BM  . feeding supplement (PRO-STAT SUGAR FREE 64)  30 mL Oral Q1500    Time spent on care of this patient: 40 mins   Drema Dallas , MD   [facility] Office  [phone] Pager [phone]  On-Call/Text Page:      [email or web address]      password [email or web address]  If 7PM-7AM, please contact night-coverage [email or web address] Password [email or web address] 08/13/2014, 6:45 PM   LOS: 12 days

## 2014-08-14 LAB — CBC WITH DIFFERENTIAL/PLATELET
BASOS ABS: 0 10*3/uL (ref 0.0–0.1)
BASOS PCT: 0 % (ref 0–1)
EOS ABS: 0 10*3/uL (ref 0.0–0.7)
Eosinophils Relative: 0 % (ref 0–5)
HCT: 45.3 % (ref 36.0–46.0)
HEMOGLOBIN: 15 g/dL (ref 12.0–15.0)
Lymphocytes Relative: 16 % (ref 12–46)
Lymphs Abs: 2 10*3/uL (ref 0.7–4.0)
MCH: 31.3 pg (ref 26.0–34.0)
MCHC: 33.1 g/dL (ref 30.0–36.0)
MCV: 94.4 fL (ref 78.0–100.0)
MONO ABS: 0.4 10*3/uL (ref 0.1–1.0)
Monocytes Relative: 3 % (ref 3–12)
NEUTROS ABS: 10.4 10*3/uL — AB (ref 1.7–7.7)
Neutrophils Relative %: 81 % — ABNORMAL HIGH (ref 43–77)
Platelets: 420 10*3/uL — ABNORMAL HIGH (ref 150–400)
RBC: 4.8 MIL/uL (ref 3.87–5.11)
RDW: 12.4 % (ref 11.5–15.5)
WBC: 12.9 10*3/uL — ABNORMAL HIGH (ref 4.0–10.5)

## 2014-08-14 LAB — COMPREHENSIVE METABOLIC PANEL
ALT: 26 U/L (ref 0–35)
AST: 19 U/L (ref 0–37)
Albumin: 3.6 g/dL (ref 3.5–5.2)
Alkaline Phosphatase: 101 U/L (ref 39–117)
Anion gap: 6 (ref 5–15)
BILIRUBIN TOTAL: 0.5 mg/dL (ref 0.3–1.2)
BUN: 13 mg/dL (ref 6–23)
CHLORIDE: 106 meq/L (ref 96–112)
CO2: 29 mmol/L (ref 19–32)
CREATININE: 0.65 mg/dL (ref 0.50–1.10)
Calcium: 9.5 mg/dL (ref 8.4–10.5)
GLUCOSE: 138 mg/dL — AB (ref 70–99)
Potassium: 4.2 mmol/L (ref 3.5–5.1)
Sodium: 141 mmol/L (ref 135–145)
Total Protein: 7.2 g/dL (ref 6.0–8.3)

## 2014-08-14 LAB — MAGNESIUM: Magnesium: 2.4 mg/dL (ref 1.5–2.5)

## 2014-08-14 MED ORDER — PANTOPRAZOLE SODIUM 40 MG PO TBEC
40.0000 mg | DELAYED_RELEASE_TABLET | Freq: Every day | ORAL | Status: DC
  Administered 2014-08-14 – 2014-08-15 (×2): 40 mg via ORAL
  Filled 2014-08-14 (×2): qty 1

## 2014-08-14 MED ORDER — DEXAMETHASONE 2 MG PO TABS
2.0000 mg | ORAL_TABLET | Freq: Two times a day (BID) | ORAL | Status: DC
  Administered 2014-08-14 – 2014-08-15 (×2): 2 mg via ORAL
  Filled 2014-08-14 (×3): qty 1

## 2014-08-14 NOTE — Progress Notes (Signed)
Pt arrived to unit per wheelchair fro 3S by nurse tech. No acute distress noted  family at bedside.   Will monitor   Andrew Au I 08/14/2014 6:39 PM

## 2014-08-14 NOTE — Progress Notes (Signed)
Patient ID: Wendy Delgado, female   DOB: 1986-03-23, 29 y.o.   MRN: [id number] Patient is awake and alert. Denies any headache. I have reviewed the notes and recommendations of infectious disease I discussed the situation with my partners here. I've also sought consultation with my colleagues in the [place] office Dr. Adria Dill [name] is reviewing the case now for consideration of surgical intervention. At the current time I would recommend decreasing the Decadron and will taper it 2 mg twice a day and reduce it further. It appears that she could be transferred to the floor for further care if necessary

## 2014-08-14 NOTE — Progress Notes (Signed)
Utilization review completed.  

## 2014-08-14 NOTE — Progress Notes (Signed)
Report called to Rexene Edison on 4N. Pt's VSS, alert and oriented, no c/o pain, all due meds given. Personal belongings with pt's family at bedside. Elink and CCMD notified of transfer, monitor removed. Pt transferring to 4N15.

## 2014-08-14 NOTE — Progress Notes (Signed)
[place] TEAM 1 - Stepdown/ICU TEAM Progress Note  Tanekia Wendy Delgado ZOX:[id number] DOB: 1985/09/07 DOA: 08/01/2014 PCP: No primary care provider on file.  Admit HPI / Brief Narrative: 29 y.o. F with no significant PMH who was taken to [facility] on 12/22 after her husband found her unresponsive at their home. She had a severe headache earlier that afternoon, but did not seek medical attention then. While in the ED the pt suffered a seizure which then led to status epilepticus. She was intubated by the EDP and transferred to [facility] neuro ICU for further evaluation.  HPI/Subjective: Resting comfortably in bedside chair.  Denies cp, sob, n/v, or abdom pain.  Only modest HA.    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 Dutch Quint (Neurosurgery) and Neurosurgery believes the risks outweigh the benefits of endoscopic cyst removal. Plan is for Neurosurgery to meet with ID early next week to finalize plan (i.e. transfer to academic center for endoscopic cyst removal vs continuing medication treatment) -S/P Right parietal ventriculoperitoneal shunt placement and revision from right parieto-occipital to right frontal shunt -Currently stable - transfer to Neuro bed while ultimate tx plan finalized    Neurocysticercosis  -serum T Solium antibodies by ELISA negative -CSF for T solium antibodies by ELISA to [facility] labs pending -Continue Decadron - add protonix for GI prophy while on high dose steroid  -Spoke with Dr. Judyann Munson (ID) : Albendazole Is NOT TO BE STARTED  Acute respiratory failure -Resolved - patient on room air    Code Status: FULL Family Communication: no family  present at time of exam Disposition Plan: transfer to neuro bed - ultimate tx plan per ID and NS  Consultants: Dr. Judyann Munson (infectious disease) Dr. Barnett Abu (neurosurgery)  Procedure/Significant Events: 12/22 CT Head w/o contrast; No intracranial hemorrhage or midline shift.- Moderate hydrocephalus. 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  12/29 Right parietal ventriculoperitoneal shunt 12/29 Revision of ventricular catheter placement from right parieto-occipital to right frontal  Antibiotics: NA  DVT prophylaxis: Lovenox  Objective: Blood pressure 111/68, pulse 55, temperature 98.5 F (36.9 C), temperature source Oral, resp. rate 16, height  (1.626 m), weight 86.5 kg (190 lb 11.2 oz), last menstrual period 07/08/2014, SpO2 100 %, not currently breastfeeding.  Intake/Output Summary (Last 24 hours) at 08/14/14 1319 Last data filed at 08/13/14 2000  Gross per 24 hour  Intake    360 ml  Output      0 ml  Net    360 ml   Exam: General: aler, oriented, and interactive   Lungs: Clear to auscultation bilaterally without wheezes or crackles Cardiovascular: RRR w/o M, G, or rub  Abdomen: Nontender, nondistended, soft, bowel sounds positive, no rebound, no ascites, no appreciable mass Extremities: No significant cyanosis, clubbing, or edema bilateral lower extremities  Data Reviewed: Basic Metabolic Panel:  Recent Labs Lab 08/10/14 0231 08/11/14 0251 08/12/14 0230  08/13/14 0225 08/14/14 0324  NA 140 140 141 139 141  K 3.7 4.8 4.0  4.0 4.2  CL 107 108 106 107 106  CO2 GLUCOSE 95 115* 140* 139* 138*  BUN CREATININE 0.55 0.50 0.61 0.64 0.65  CALCIUM 9.0 9.6 9.5 9.4 9.5  MG  --  2.4 2.4 2.3 2.4   Liver Function Tests:  Recent Labs Lab 08/11/14 0251 08/12/14 0230 08/13/14 0225 08/14/14 0324  AST ALT 31 31 32 26  ALKPHOS 115 109 107 101  BILITOT 0.9 0.4 0.3 0.5  PROT 7.5 7.3 7.7 7.2  ALBUMIN 3.7 3.7 3.6 3.6   CBC:  Recent Labs Lab 08/10/14 0231 08/11/14 0251 08/12/14 0230 08/13/14 0225 08/14/14 0324  WBC 9.3 10.2 11.0* 11.1* 12.9*  NEUTROABS  --  8.9* 9.0* 9.0* 10.4*  HGB 13.2 14.9 14.9 15.0 15.0  HCT 40.4 44.4 43.9 44.0 45.3  MCV 94.4 96.5 95.4 94.4 94.4  PLT 287 310 384 381 420*   Studies:  Recent x-ray studies have been reviewed in detail by the Attending Physician  Scheduled Meds:  Scheduled Meds: . dexamethasone  2 mg Oral Q12H  . enoxaparin (LOVENOX) injection  40 mg Subcutaneous Daily  . feeding supplement (ENSURE COMPLETE)  237 mL Oral BID BM  . feeding supplement (PRO-STAT SUGAR FREE 64)  30 mL Oral Q1500    Time spent on care of this patient: 35 mins  Lonia Blood, MD [facility] For Consults/Admissions - Flow Manager - [phone] Office  [phone]  Contact MD directly via text page:      [email or web address]      password [email or web address]  08/14/2014, 1:19 PM   LOS: 13 days

## 2014-08-14 NOTE — Progress Notes (Signed)
Occupational Therapy Treatment Patient Details Name: Wendy Delgado MRN: [id number] DOB: 1986-07-02 Today's Date: 08/14/2014    History of present illness Patient is a 29 y.o. female from [place] who presented to [facility] on 08/01/14 after being found unresponsive.  She had been complaining of a severe headache and had a generalized seizure in the ED.  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  Intubated 12/22-12/24.  Shunt placed 12/29.   OT comments  Pt moving well. Education provided to pt and telephone interpreters used in session. D/c recommendations updated. Interpreters: Nickolas Madrid [phone] and Caron Presume [id number]  Follow Up Recommendations  Home health OT;Supervision/Assistance - 24 hour    Equipment Recommendations  Tub/shower seat    Recommendations for Other Services      Precautions / Restrictions Precautions Precautions: Fall Restrictions Weight Bearing Restrictions: No       Mobility Bed Mobility     General bed mobility comments: not assessed  Transfers Overall transfer level: Needs assistance Equipment used: None Transfers: Sit to/from Stand Sit to Stand: Min guard          General transfer comment: reports dizziness        ADL Overall ADL's : Needs assistance/impaired     Grooming: Wash/dry face;Oral care;Applying deodorant;Set up;Supervision/safety;Standing   Upper Body Bathing: Set up;Supervision/ safety;Standing   Lower Body Bathing: Set up;Supervison/ safety (standing)       Lower Body Dressing: Set up;Sitting/lateral leans (socks)   Toilet Transfer: Min guard;Ambulation (chair)           Functional mobility during ADLs: Min guard General ADL Comments: Educated on safety tips such as safe shoewear and sitting for LB ADLs. Recommended someone be with pt for tub/shower transfer and to  hold onto wall.  Pt reported dizziness in session. Initial interpreter was lost on phone somehow-tried talking to pt without interpreter but she did not understand, so called back and received another interpreter.      Vision                     Perception     Praxis      Cognition  Awake/Alert Behavior During Therapy: WFL for tasks assessed/performed Overall Cognitive Status: Difficult to assess due to Non-English speaking                       Extremity/Trunk Assessment               Exercises     Shoulder Instructions       General Comments      Pertinent Vitals/ Pain       Pain Assessment: No/denies pain; O2 reporting in 70's-80's in session but did not appear accurate reading. Cues for deep breathing technique.  Home Living                                          Prior Functioning/Environment              Frequency Min 2X/week     Progress Toward Goals  OT Goals(current goals can now be found in the care plan section)  Progress towards OT goals: Progressing toward goals  Acute Rehab OT Goals Patient Stated Goal: go home OT Goal Formulation: With patient Time  For Goal Achievement: 08/21/14 Potential to Achieve Goals: Good ADL Goals Pt Will Perform Grooming: with supervision;standing Pt Will Perform Lower Body Bathing: with supervision;sit to/from stand Pt Will Perform Lower Body Dressing: with supervision;sit to/from stand Pt Will Transfer to Toilet: with supervision;ambulating;regular height toilet Pt Will Perform Toileting - Clothing Manipulation and hygiene: with supervision;sit to/from stand Pt Will Perform Tub/Shower Transfer: Shower transfer;with supervision;ambulating Additional ADL Goal #1: Pt will gather items necessary for ADL with supervision.  Plan Discharge plan needs to be updated    Co-evaluation                 End of Session Equipment Utilized During Treatment: Gait belt   Activity  Tolerance Patient tolerated treatment well   Patient Left in chair;with call bell/phone within reach;with family/visitor present   Nurse Communication Mobility status;Other (comment) (O2 and dizziness)        Time: 9604-5409 (approximately 2 minutes waiting on new interpreter on phone) OT Time Calculation (min): 17 min  Charges: OT General Charges $OT Visit: 1 Procedure OT Treatments $Self Care/Home Management : 8-22 mins  Earlie Raveling OTR/L [phone] 08/14/2014, 11:43 AM

## 2014-08-14 NOTE — Progress Notes (Signed)
Physical Therapy Treatment Patient Details Name: Wendy Tamecca [id number] DOB: July 19, 1986 Today's Date: 08/14/2014    History of Present Illness Patient is a 29 y.o. female from [place] who presented to [facility] on 08/01/14 after being found unresponsive.  She had been complaining of a severe headache and had a generalized seizure in the ED.  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  Intubated 12/22-12/24.  Shunt placed 12/29.    PT Comments    Pt much improved today with mobility and denies headache, dizziness, or fatigue during mobility.  If pt continues to mobilize this well, feel she will no longer require CIR at D/C.  Will continue to follow.    Follow Up Recommendations  Home health PT;Supervision - Intermittent     Equipment Recommendations  None recommended by PT    Recommendations for Other Services       Precautions / Restrictions Precautions Precautions: Fall Restrictions Weight Bearing Restrictions: No    Mobility  Bed Mobility Overal bed mobility: Needs Assistance Bed Mobility: Supine to Sit     Supine to sit: Supervision     General bed mobility comments: pt much improved and no A needed today, but did use bed rail for A.    Transfers Overall transfer level: Needs assistance Equipment used: None Transfers: Sit to/from Stand Sit to Stand: Supervision         General transfer comment: pt demos better balance today and denies dizziness.  Utilizes UE support, but demos safe technique.    Ambulation/Gait Ambulation/Gait assistance: Min guard Ambulation Distance (Feet): 160 Feet Assistive device: None Gait Pattern/deviations: Step-through pattern;Decreased stride length   Gait velocity interpretation: Below normal speed for age/gender General Gait Details: pt much more steady today,  denies headache, denies dizziness, and no c/o fatigue.  Attempted changes in gait,speed, however pt unable to demo much change in speed.     Stairs            Wheelchair Mobility    Modified Rankin (Stroke Patients Only)       Balance Overall balance assessment: Needs assistance Sitting-balance support: No upper extremity supported;Feet supported Sitting balance-Leahy Scale: Good     Standing balance support: No upper extremity supported;During functional activity Standing balance-Leahy Scale: Fair Standing balance comment: Difficulty with balance challenges.                      Cognition Arousal/Alertness: Awake/alert Behavior During Therapy: Flat affect (but a little more interactive today.  ) Overall Cognitive Status: Difficult to assess                      Exercises      General Comments        Pertinent Vitals/Pain Pain Assessment: No/denies pain    Home Living                      Prior Function            PT Goals (current goals can now be found in the care plan section) Acute Rehab PT Goals Patient Stated Goal: return home with family PT Goal Formulation: With patient Time For Goal Achievement: 08/28/14 Potential to Achieve Goals: Good Progress towards PT goals: Progressing toward goals    Frequency  Min 3X/week    PT Plan Discharge plan needs to  be updated    Co-evaluation             End of Session Equipment Utilized During Treatment: Gait belt Activity Tolerance: Patient tolerated treatment well Patient left: in chair;with call bell/phone within reach     Time: 0910-0923 PT Time Calculation (min) (ACUTE ONLY): 13 min  Charges:  $Gait Training: 8-22 mins                    G CodesSunny Schlein, [place] [phone] 08/14/2014, 10:06 AM

## 2014-08-14 NOTE — Progress Notes (Signed)
Report recd from [place], RN on 3S. Will monitor for pt's arrival to unit.  Andrew Au I 08/14/2014 6:16 PM

## 2014-08-15 DIAGNOSIS — R4020 Unspecified coma: Secondary | ICD-10-CM | POA: Diagnosis not present

## 2014-08-15 DIAGNOSIS — G911 Obstructive hydrocephalus: Secondary | ICD-10-CM | POA: Diagnosis not present

## 2014-08-15 DIAGNOSIS — R001 Bradycardia, unspecified: Secondary | ICD-10-CM | POA: Diagnosis not present

## 2014-08-15 DIAGNOSIS — R410 Disorientation, unspecified: Secondary | ICD-10-CM | POA: Diagnosis not present

## 2014-08-15 DIAGNOSIS — G9341 Metabolic encephalopathy: Secondary | ICD-10-CM | POA: Diagnosis not present

## 2014-08-15 DIAGNOSIS — R4182 Altered mental status, unspecified: Secondary | ICD-10-CM | POA: Diagnosis present

## 2014-08-15 DIAGNOSIS — B690 Cysticercosis of central nervous system: Secondary | ICD-10-CM | POA: Diagnosis not present

## 2014-08-15 DIAGNOSIS — G40801 Other epilepsy, not intractable, with status epilepticus: Secondary | ICD-10-CM | POA: Diagnosis not present

## 2014-08-15 DIAGNOSIS — J9600 Acute respiratory failure, unspecified whether with hypoxia or hypercapnia: Secondary | ICD-10-CM | POA: Diagnosis not present

## 2014-08-15 LAB — MISCELLANEOUS TEST

## 2014-08-15 MED ORDER — DEXAMETHASONE 2 MG PO TABS
2.0000 mg | ORAL_TABLET | Freq: Two times a day (BID) | ORAL | Status: DC
Start: 2014-08-15 — End: 2014-09-26

## 2014-08-15 MED ORDER — PANTOPRAZOLE SODIUM 40 MG PO TBEC: 40.0000 mg | DELAYED_RELEASE_TABLET | Freq: Every day | ORAL | Status: DC

## 2014-08-15 NOTE — Progress Notes (Signed)
Patient ID: Wendy HireFlorencia Kaufhold, female   DOB: 1986/07/27, 29 y.o.   MRN: [id number] Dr. Adria DillMark Van Poppel, neurosurgeon in [place] is willing to accept the patient in transfer for surgical decompression and removal of the third ventricular cyst. My office will be or naming transfer with [facility].

## 2014-08-15 NOTE — Discharge Summary (Signed)
Physician Discharge Summary  Wendy Delgado ZOX:[id number] DOB: Dec 30, 1985 DOA: 08/01/2014  PCP: No primary care provider on file.  Admit date: 08/01/2014 Discharge date: 08/15/2014  Recommendations for Outpatient Follow-up:  1. Pt will be transferred to [facility] 2. Accepting doctor is Dr. 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 F with no significant PMH who was [facility] on 12/22 after her husband found her unresponsive at their home. She had a severe headache earlier that afternoon, but did not seek medical attention then. While in the ED the pt suffered a seizure which then led to status epilepticus. She was intubated by the EDP and [facility] for further evaluation.  HPI/Subjective: Resting comfortably in bedside chair. Denies cp, sob, n/v, or abdom pain. Only modest HA.   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 Danielle DessElsner neurosurgeon, plan is to transfer [facility] today, pt accepted - accepting doctor Dr. Sandie AnoMark Vantople  - S/P Right parietal ventriculoperitoneal shunt placement and revision from right parieto-occipital to right frontal  shunt - Currently stable  Neurocysticercosis  - serum T Solium antibodies by ELISA negative - CSF for T solium antibodies by ELISA to [facility] labs pending - Continue Decadron - added protonix for GI prophylaxis while on high dose steroid  - Dr. Sharon SellerMcClung spoke with Dr. Judyann Munsonynthia Snider (ID) : Albendazole Is NOT TO BE STARTED  Acute respiratory failure -Resolved - patient on room air    Code Status: FULL Family Communication: family at bedside  Disposition Plan: [facility]   Consultants: Dr. Judyann Munsonynthia Snider (infectious disease) Dr. Barnett AbuHenry Elsner (neurosurgery)  Procedure/Significant Events: 12/22 CT Head w/o contrast; No intracranial hemorrhage or midline shift.- Moderate hydrocephalus. 12/23 MRI Head; 14 x 17 x 14 mm cystic intraventricular mass with internal nodularity positioned at the floor of the third ventricle - secondary obstruction of the cerebral aqueduct with obstructive hydrocephalus involving the lateral and third ventricles and transependymal flow of CSF - suggestion additional cystic intraventricular lesions located more superiorly within the lateral ventricles bilaterally-This constellation of findings suggests possible neurocysticercosis.  12/29 Right parietal ventriculoperitoneal shunt 12/29 Revision of ventricular catheter placement from right parieto-occipital to right frontal  DVT prophylaxis: Lovenox SQ   Discharge Exam: Filed Vitals:   08/15/14 1342  BP: 104/52  Pulse: 64  Temp: 98.5 F (36.9 C)  Resp: 17   Filed Vitals:   08/15/14 0432 08/15/14 0554 08/15/14 0913 08/15/14 1342  BP:  111/61 96/55 104/52  Pulse:  45 55 64  Temp:  97.7 F (36.5 C) 97.8 F (36.6 C) 98.5 F (36.9 C)  TempSrc:  Oral Oral Oral  Resp:  16 17 17   Height:      Weight: 84.596 kg (  186 lb 8 oz)     SpO2:  100% 100% 100%    General: Pt is alert, not in acute distress Cardiovascular: Regular rate and rhythm, S1/S2 +, no murmurs, no rubs, no  gallops Respiratory: Clear to auscultation bilaterally, no wheezing, no crackles, no rhonchi Abdominal: Soft, non tender, non distended, bowel sounds +, no guarding  Discharge Instructions  Discharge Instructions    Diet - low sodium heart healthy    Complete by:  As directed      Increase activity slowly    Complete by:  As directed             Medication List    STOP taking these medications        diclofenac 50 MG EC tablet  Commonly known as:  VOLTAREN     naproxen 500 MG tablet  Commonly known as:  NAPROSYN     SPRINTEC 28 PO      TAKE these medications        acetaminophen 500 MG tablet  Commonly known as:  TYLENOL  Take 500 mg by mouth every 6 (six) hours as needed for mild pain or moderate pain.     dexamethasone 2 MG tablet  Commonly known as:  DECADRON  Take 1 tablet (2 mg total) by mouth every 12 (twelve) hours.     pantoprazole 40 MG tablet  Commonly known as:  PROTONIX  Take 1 tablet (40 mg total) by mouth daily at 12 noon.           The results of significant diagnostics from this hospitalization (including imaging, microbiology, ancillary and laboratory) are listed below for reference.     Microbiology: No results found for this or any previous visit (from the past 240 hour(s)).   Labs: Basic Metabolic Panel:  Recent Labs Lab 08/10/14 0231 08/11/14 0251 08/12/14 0230 08/13/14 0225 08/14/14 0324  NA 140 140 141 139 141  K 3.7 4.8 4.0 4.0 4.2  CL 107 108 106 107 106  CO2 GLUCOSE 95 115* 140* 139* 138*  BUN CREATININE 0.55 0.50 0.61 0.64 0.65  CALCIUM 9.0 9.6 9.5 9.4 9.5  MG  --  2.4 2.4 2.3 2.4   Liver Function Tests:  Recent Labs Lab 08/11/14 0251 08/12/14 0230 08/13/14 0225 08/14/14 0324  AST ALT 31 31 32 26  ALKPHOS 115 109 107 101  BILITOT 0.9 0.4 0.3 0.5  PROT 7.5 7.3 7.7 7.2  ALBUMIN 3.7 3.7 3.6 3.6   CBC:  Recent Labs Lab 08/10/14 0231 08/11/14 0251  08/12/14 0230 08/13/14 0225 08/14/14 0324  WBC 9.3 10.2 11.0* 11.1* 12.9*  NEUTROABS  --  8.9* 9.0* 9.0* 10.4*  HGB 13.2 14.9 14.9 15.0 15.0  HCT 40.4 44.4 43.9 44.0 45.3  MCV 94.4 96.5 95.4 94.4 94.4  PLT 287 310 384 381 420*   SIGNED: Time coordinating discharge: Over 30 minutes  Debbora Presto, MD  [facility] 08/15/2014, 2:22 PM Pager [phone] Cell [phone]  If 7PM-7AM, please contact night-coverage [email or web address] Password [email or web address]

## 2014-08-15 NOTE — Progress Notes (Signed)
Report given to Thurston HoleAnne, Charity fundraiserN at [facility]. Pt to be transported via ambulance to facility. Will monitor   Andrew AuVafiadis, Ladislav Caselli I 08/15/2014 6:41 PM

## 2014-08-15 NOTE — Progress Notes (Signed)
Assessment performed as charted. Spanish interpreter [phone]. Will continue to monitor pt closely   Andrew AuVafiadis, Liannah Yarbough I 08/15/2014 6:41 PM

## 2014-08-15 NOTE — Progress Notes (Signed)
Nurse called [facility] and informed of pt's dc to [facility].  Pt to be transported via ambulance to facility. Will monitor   Andrew AuVafiadis, Lizvette Lightsey I 08/15/2014 6:51 PM

## 2014-08-15 NOTE — Discharge Instructions (Signed)

## 2014-08-15 NOTE — Progress Notes (Signed)
    [facility]   VERY HAPPY TO HEAR THAT DR. 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 NEUROSURGERY IS CRITICAL FOR TREATMENT AND AT THIS POINT MAY ALSO BE CRITICAL IN DIAGNOSIS GIVEN NEGATIVE TESTS SO FAR

## 2014-08-15 NOTE — Progress Notes (Signed)
Physical Therapy Treatment Patient Details Name: Wendy Delgado MRN: [id number] DOB: 1985/11/24 Today's Date: 08/15/2014    History of Present Illness Patient is a 29 y[place] who presented to [facility] on 08/01/14 after being found unresponsive.  She had been complaining of a severe headache and had a generalized seizure in the ED.  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  Intubated 12/22-12/24.  Shunt placed 12/29.    PT Comments    Patient making great progress. No complaints of dizziness or pain. Able to complete stairs as she stated she had in her house. Will continue with current POC  Follow Up Recommendations  Home health PT;Supervision - Intermittent     Equipment Recommendations  None recommended by PT    Recommendations for Other Services       Precautions / Restrictions Restrictions Weight Bearing Restrictions: No    Mobility  Bed Mobility Overal bed mobility: Modified Independent                Transfers Overall transfer level: Needs assistance Equipment used: None   Sit to Stand: Supervision         General transfer comment:   Utilizes UE support, but demos safe technique.    Ambulation/Gait Ambulation/Gait assistance: Min guard Ambulation Distance (Feet): 400 Feet Assistive device: None Gait Pattern/deviations: Step-through pattern;Staggering right   Gait velocity interpretation: Below normal speed for age/gender General Gait Details: Patient staggers right occasionally but able to self correct and maintain balance without assistance   Stairs Stairs: Yes Stairs assistance: Min assist Stair Management: Step to pattern;Forwards;Two rails Number of Stairs: 3 General stair comments: Initial stumble on first step due to decreased foot clearance but otherwise stable  with use of 2 rails  Wheelchair Mobility    Modified Rankin (Stroke Patients Only)       Balance                                    Cognition Arousal/Alertness: Awake/alert Behavior During Therapy: WFL for tasks assessed/performed Overall Cognitive Status: Difficult to assess                      Exercises      General Comments        Pertinent Vitals/Pain Pain Assessment: No/denies pain    Home Living                      Prior Function            PT Goals (current goals can now be found in the care plan section) Progress towards PT goals: Progressing toward goals    Frequency  Min 3X/week    PT Plan Current plan remains appropriate    Co-evaluation             End of Session Equipment Utilized During Treatment: Gait belt Activity Tolerance: Patient tolerated treatment well Patient left: in bed;with call bell/phone within reach     Time: 0946-1000 PT Time Calculation (min) (ACUTE ONLY): 14 min  Charges:  $Gait Training: 8-22 mins                    G Codes:      Fredrich BirksRobinette, Julia Elizabeth 08/15/2014, 11:32 AM 08/15/2014 Robinette,  Tonia Brooms PTA [phone] pager [phone] office

## 2014-08-25 ENCOUNTER — Emergency Department (HOSPITAL_COMMUNITY)
Admission: EM | Admit: 2014-08-25 | Discharge: 2014-08-25 | Disposition: A | Discharge status: Home / Self Care | Payer: Self-pay | Attending: Emergency Medicine | Admitting: Emergency Medicine

## 2014-08-25 ENCOUNTER — Encounter (HOSPITAL_COMMUNITY): Payer: Self-pay | Admitting: Emergency Medicine

## 2014-08-25 ENCOUNTER — Emergency Department (HOSPITAL_COMMUNITY): Payer: Self-pay

## 2014-08-25 DIAGNOSIS — R51 Headache: Secondary | ICD-10-CM

## 2014-08-25 DIAGNOSIS — G8929 Other chronic pain: Secondary | ICD-10-CM | POA: Insufficient documentation

## 2014-08-25 DIAGNOSIS — R519 Headache, unspecified: Secondary | ICD-10-CM

## 2014-08-25 DIAGNOSIS — G919 Hydrocephalus, unspecified: Secondary | ICD-10-CM

## 2014-08-25 DIAGNOSIS — R200 Anesthesia of skin: Secondary | ICD-10-CM | POA: Insufficient documentation

## 2014-08-25 LAB — CBC WITH DIFFERENTIAL/PLATELET
BASOS PCT: 0 % (ref 0–1)
Basophils Absolute: 0 10*3/uL (ref 0.0–0.1)
EOS PCT: 3 % (ref 0–5)
Eosinophils Absolute: 0.3 10*3/uL (ref 0.0–0.7)
HEMATOCRIT: 40.7 % (ref 36.0–46.0)
HEMOGLOBIN: 13.4 g/dL (ref 12.0–15.0)
Lymphocytes Relative: 30 % (ref 12–46)
Lymphs Abs: 2.5 10*3/uL (ref 0.7–4.0)
MCH: 32.1 pg (ref 26.0–34.0)
MCHC: 32.9 g/dL (ref 30.0–36.0)
MCV: 97.4 fL (ref 78.0–100.0)
Monocytes Absolute: 0.5 10*3/uL (ref 0.1–1.0)
Monocytes Relative: 6 % (ref 3–12)
NEUTROS ABS: 5 10*3/uL (ref 1.7–7.7)
Neutrophils Relative %: 60 % (ref 43–77)
Platelets: 230 10*3/uL (ref 150–400)
RBC: 4.18 MIL/uL (ref 3.87–5.11)
RDW: 13.3 % (ref 11.5–15.5)
WBC: 8.4 10*3/uL (ref 4.0–10.5)

## 2014-08-25 LAB — BASIC METABOLIC PANEL
Anion gap: 6 (ref 5–15)
BUN: 6 mg/dL (ref 6–23)
CALCIUM: 8.8 mg/dL (ref 8.4–10.5)
CO2: 25 mmol/L (ref 19–32)
Chloride: 106 mEq/L (ref 96–112)
Creatinine, Ser: 0.54 mg/dL (ref 0.50–1.10)
GFR calc Af Amer: >90 mL/min (ref >90)
GFR calc non Af Amer: >90 mL/min (ref >90)
GLUCOSE: 90 mg/dL (ref 70–99)
POTASSIUM: 3.7 mmol/L (ref 3.5–5.1)
Sodium: 137 mmol/L (ref 135–145)

## 2014-08-25 MED ORDER — MORPHINE SULFATE 4 MG/ML IJ SOLN
4.0000 mg | Freq: Once | INTRAMUSCULAR | Status: AC
  Administered 2014-08-25: 4 mg via INTRAVENOUS
  Filled 2014-08-25: qty 1

## 2014-08-25 MED ORDER — HYDROCODONE-ACETAMINOPHEN 5-325 MG PO TABS
2.0000 | ORAL_TABLET | Freq: Once | ORAL | Status: AC
  Administered 2014-08-25: 2 via ORAL
  Filled 2014-08-25: qty 2

## 2014-08-25 NOTE — ED Notes (Signed)
MD at bedside. 

## 2014-08-25 NOTE — ED Notes (Signed)
Pt reports cyst in 4th ventricle, shunt installed here.  Pt was dc from [facility] in [place] on Monday, reports they were unable to remove cyst.  Pt reports more cysts were found in front of head.  Pt seems drowsy.

## 2014-08-25 NOTE — ED Provider Notes (Signed)
CSN: [id number]     Arrival date & time 08/25/14  0932 History   First MD Initiated Contact with Patient 08/25/14 1041     Chief Complaint  Patient presents with  . Weakness     (Consider location/radiation/quality/duration/timing/severity/associated sxs/prior Treatment) HPI Comments: 29 y/o comes in with cc of headache. Pt was recently found to have a neurocystercosis and is s/p VP shunt placement. She has a complex case, and is supposed to get [facility]. Pt reports that starting last night, she has had worsening of her chronic pain. Pt also has some generalized weakness, and some feeling that her extremities are "sleeping." No LOC, confusion, but family endorses that pt is more forgetful. Taking vicodin at home.  Patient is a 29 y.o. female presenting with weakness. The history is provided by the patient. A language interpreter was used.  Weakness Associated symptoms include headaches. Pertinent negatives include no chest pain, no abdominal pain and no shortness of breath.    Past Medical History  Diagnosis Date  . Late prenatal care   . Headache    Past Surgical History  Procedure Laterality Date  . Shunt revision ventricular-peritoneal N/A 08/08/2014    Procedure: SHUNT REVISION VENTRICULAR-PERITONEAL;  Surgeon: Barnett AbuHenry Elsner, MD;  Location: [facility];  Service: Neurosurgery;  Laterality: N/A;  . Ventriculoperitoneal shunt Right 08/08/2014    Procedure: SHUNT INSERTION VENTRICULAR-PERITONEAL;  Surgeon: Barnett AbuHenry Elsner, MD;  [facility];  Service: Neurosurgery;  Laterality: Right;   Family History  Problem Relation Age of Onset  . Diabetes     History  Substance Use Topics  . Smoking status: Never Smoker   . Smokeless tobacco: Never Used  . Alcohol Use: No   OB History    Gravida Para Term Preterm AB TAB SAB Ectopic Multiple Living   1 1 1  0 0 0 0 0 0 1     Review of Systems  Constitutional: Positive for activity change.  Respiratory: Negative for  shortness of breath.   Cardiovascular: Negative for chest pain.  Gastrointestinal: Negative for nausea, vomiting and abdominal pain.  Genitourinary: Negative for dysuria.  Musculoskeletal: Negative for neck pain.  Neurological: Positive for weakness, numbness and headaches. Negative for dizziness, tremors, facial asymmetry, speech difficulty and light-headedness.  All other systems reviewed and are negative.     Allergies  Review of patient's allergies indicates no known allergies.  Home Medications   Prior to Admission medications   Medication Sig Start Date End Date Taking? Authorizing Provider  acetaminophen (TYLENOL) 500 MG tablet Take 500 mg by mouth every 6 (six) hours as needed for mild pain or moderate pain.   Yes Historical Provider, MD  Hydrocodone-Acetaminophen (NORCO PO) Take 1 tablet by mouth once.   Yes Historical Provider, MD  dexamethasone (DECADRON) 2 MG tablet Take 1 tablet (2 mg total) by mouth every 12 (twelve) hours. Patient not taking: Reported on 08/25/2014 08/15/14   Dorothea OgleIskra M Myers, MD  pantoprazole (PROTONIX) 40 MG tablet Take 1 tablet (40 mg total) by mouth daily at 12 noon. Patient not taking: Reported on 08/25/2014 08/15/14   Dorothea OgleIskra M Myers, MD   BP 90/45 mmHg  Pulse 56  Temp(Src) 98.3 F (36.8 C) (Oral)  Resp 23  Ht 5\' 2"  (1.575 m)  Wt 197 lb (89.359 kg)  BMI 36.02 kg/m2  SpO2 98%  LMP 07/08/2014 (LMP Unknown) Physical Exam  Constitutional: She is oriented to person, place, and time. She appears well-developed and well-nourished.  HENT:  Head: Normocephalic and atraumatic.  Eyes: EOM are normal. Pupils are equal, round, and reactive to light.  Neck: Neck supple.  Cardiovascular: Normal rate, regular rhythm and normal heart sounds.   No murmur heard. Pulmonary/Chest: Effort normal. No respiratory distress.  Abdominal: Soft. She exhibits no distension. There is no tenderness. There is no rebound and no guarding.  Neurological: She is alert and oriented  to person, place, and time. No cranial nerve deficit. Coordination normal.  Able to discriminate between sharp and dull for both upper and lower extremity and the patellar and bicepital reflex are 2+ bilaterally  Skin: Skin is warm and dry.    ED Course  Procedures (including critical care time) Labs Review Labs Reviewed  CBC WITH DIFFERENTIAL  BASIC METABOLIC PANEL    Imaging Review Ct Head Wo Contrast  08/25/2014   CLINICAL DATA:  Weakness since this morning. Headache since last night. Nausea.  EXAM: CT HEAD WITHOUT CONTRAST  TECHNIQUE: Contiguous axial images were obtained from the base of the skull through the vertex without intravenous contrast.  COMPARISON:  08/09/2014.  FINDINGS: The right frontal ventriculoperitoneal shunt catheter is unchanged. The tip remains in the right thalamus. The ventricles are normal in size and position. The previously seen intracranial air is no longer demonstrated. No intracranial hemorrhage, mass lesion or CT evidence of acute infarction. Unremarkable bones and included paranasal sinuses.  IMPRESSION: No acute abnormality.   Electronically Signed   By: Gordan Payment M.D.   On: 08/25/2014 12:03     EKG Interpretation None      MDM   Final diagnoses:  Headache  Hydrocephalus    PT comes in with cc of headaches. Dx w/ neurocystercosis and had a VP shunt placed to decompress. 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 Spoke with Dr. Danielle Dess, who will come and reduce the pressure of the shunt. Pt is neurologically intact. CT head is reassuring, no worsening hydrocephalus seen.  Derwood Kaplan, MD 08/26/14 [phone]

## 2014-08-25 NOTE — ED Notes (Signed)
Patient transported to CT 

## 2014-08-25 NOTE — Discharge Instructions (Signed)
We saw you in the ER for the headache. Dr. Danielle DessElsner, the surgeon saw you, and made some changes to the pressure in your VP shunt. All the results in the ER are normal, labs and imaging. Please return to the ER if your symptoms worsen; you have increased pain, fevers, chills, inability to keep any medications down, confusion. Otherwise see the outpatient doctor as requested.   Ventriculoperitoneal Shunt Placement The human brain makes  of a liter of water and reabsorbs it through drainage channels every day. If your brain's normal drainage channels are not working properly and water builds up in the brain, the water needs to be sent someplace else. This is when a little plastic tube called a ventriculoperitoneal (VP) shunt is used to let the water flow away from the brain and into a sack in the abdomen called the peritoneum. The peritoneum absorbs this fluid and gets rid of it. A VP shunt is a bypass of fluid from the ventricles of the brain to the peritoneum of the abdomen.  This drainage of fluid is necessary for many reasons. For example, a VP shunt needs to be placed:   In young infants who have a bleed or an abnormality in their brain (intraventricular hemorrhage).  In young adults after certain kinds of brain bleeds (subarrachnoid hemorrhage).  In young woman to prevent headaches and disturbance in their vision (pseudotumor cerebry).  In the elderly who have normal pressure but are getting confused, cannot walk, and have urinary incontinence (normal pressure hydrocephalous). LET YOUR CAREGIVER KNOW ABOUT:   Recent infections.  Any foreign objects in your body from a previous surgery.  Any recent fevers or illness.  Past medical history (such as diabetes, seizure disorder, or previous abdominal surgery).  Allergies.  Medicines taken including herbs, eyedrops, over-the-counter medicines, and creams.  Use of steroids (by mouth or creams).  Past problems with numbing medicines  (anesthetics).  Possibility of pregnancy, if this applies.  History of blood clots.  History of bleeding or blood problems.  Past surgery.  Other health problems.  If you are experiencing changes in your vision.  If you are suffering from nausea.  If you have any recent onset of headaches. RISKS AND COMPLICATIONS  Bleeding. A large bleed in the brain is not very common but can cause a great deal of damage.  Infection.  Injury to the structures in the neck, chest, abdomen, or bowel.  Hernia at the surgical cut (incision) site in the abdomen.  Shunt malfunction or blockage. Infection is a common cause of a shunt not working. BEFORE THE PROCEDURE   You will be given a medicine to help you sleep (general anesthetic), and a breathing tube will be placed.  You will be given antibiotic medications to help prevent infection.  Your head, neck, chest, and abdomen will be cleaned.  Before surgery, half of your head is shaved in the area where the shunt will be inserted. PROCEDURE   Once you are sleeping, a small whole is drilled in the skull for a plastic tube to be placed into one of the pockets of fluid (cerebrospinal fluid) called the ventricles. This tube is small.  A small incision is then made over the abdomen. Another plastic tube is passed under the skin of the head, neck, and chest. It is then passed through a hole into the belly.  The 2 tubes are connected to a plastic chamber that allows the surgeon to control the flow of water from the brain to  the belly with a remote control. This is called a programmable shunt. AFTER THE PROCEDURE   You will most likely spend 24 to 48 hours in the hospital. During this time, your caregiver will look for any signs of complications from the procedure.  You will receive antibiotics for 24 hours.  Once you start passing gas, your caregiver will allow you to eat.  Once you have started eating, walking, urinating, and having bowel  movements on your own, and you are not having headaches, nausea, or vomiting, your caregiver may allow you to go home. Document Released: 07/10/2005 Document Revised: 10/20/2011 Document Reviewed: 03/02/2009 [facility] Patient Information 2015 [place], [place]. This information is not intended to replace advice given to you by your health care provider. Make sure you discuss any questions you have with your health care provider.

## 2014-08-25 NOTE — ED Notes (Signed)
Pt c/o generalized weakness onset upon waking up this morning. Last seen normal was last night. Pt did c/o headache last night. Pt reports nausea.

## 2014-08-25 NOTE — Procedures (Signed)
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 She was seen by specialist in [place] and her shunt pressure was turned to the maximum allowable. Since she has had significant headache she complains of nausea and some vomiting. She also feels feverish though no fevers documented. On examination she is sleepy but awakens answers questions appropriately she has no nuchal rigidity incisions are clean and dry a longer shunt path. Her shunt pressure will be adjusted downward slightly to help with her symptoms.  Preoperative diagnosis obstructive hydrocephalus Postoperative diagnosis obstructive hydrocephalus Procedure reprogramming of CSF shunt valve from 200 mm of water to 180 mm of water.  Patient tolerated reprogramming without difficulty. She's been advised of symptoms to be observant for area she'll be discharged from the emergency room.

## 2014-09-26 ENCOUNTER — Ambulatory Visit (INDEPENDENT_AMBULATORY_CARE_PROVIDER_SITE_OTHER): Payer: Self-pay | Admitting: Infectious Disease

## 2014-09-26 ENCOUNTER — Encounter: Payer: Self-pay | Admitting: Infectious Disease

## 2014-09-26 DIAGNOSIS — B690 Cysticercosis of central nervous system: Secondary | ICD-10-CM

## 2014-09-26 MED ORDER — PREDNISONE 20 MG PO TABS: 80.0000 mg | ORAL_TABLET | Freq: Every day | ORAL | Status: DC

## 2014-09-26 MED ORDER — ALBENDAZOLE 200 MG PO TABS: 400.0000 mg | ORAL_TABLET | Freq: Two times a day (BID) | ORAL | Status: DC

## 2014-09-26 NOTE — Progress Notes (Signed)
   Subjective:    Patient ID: Wendy Delgado, female    DOB: 1986/01/25, 29 y.o.   MRN: [id number]  HPI  29 year old [place] lady who was seen at [facility] with new seizures, obtundation. She was intubated and [facility]. MRI showed  lesions in ventricle including the 3rd ventricle causing obstruction and hydrocephalus. She underwent placement of shunt here but did not have endoscopic removal. Serologies sent her were negative for  Neurocysticercosis though we felt confident this was the dx. 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 She  Is still suffering from excruciating headaches and had her head in her hands in tears at beginning of the visit. No further seizures.   Review of Systems  Constitutional: Negative for fever, chills, diaphoresis, activity change, appetite change, fatigue and unexpected weight change.  HENT: Negative for congestion, rhinorrhea, sinus pressure, sneezing, sore throat and trouble swallowing.   Eyes: Negative for photophobia and visual disturbance.  Respiratory: Negative for cough, chest tightness, shortness of breath, wheezing and stridor.   Cardiovascular: Negative for chest pain, palpitations and leg swelling.  Gastrointestinal: Negative for nausea, vomiting, abdominal pain, diarrhea, constipation, blood in stool, abdominal distention and anal bleeding.  Genitourinary: Negative for dysuria, hematuria, flank pain and difficulty urinating.  Musculoskeletal: Negative for myalgias, back pain, joint swelling, arthralgias and gait problem.  Skin: Negative for color change, pallor, rash and wound.  Neurological: Positive for headaches. Negative for dizziness, tremors, weakness and light-headedness.  Hematological: Negative for adenopathy. Does not  bruise/bleed easily.  Psychiatric/Behavioral: Positive for dysphoric mood. Negative for behavioral problems, confusion, sleep disturbance, decreased concentration and agitation.       Objective:   Physical Exam  Constitutional: She is oriented to person, place, and time. She appears well-developed and well-nourished. No distress.  HENT:  Head: Normocephalic and atraumatic.    Mouth/Throat: No oropharyngeal exudate.  Eyes: Conjunctivae and EOM are normal. No scleral icterus.  Neck: Normal range of motion. Neck supple.  Cardiovascular: Normal rate and regular rhythm.   Pulmonary/Chest: Effort normal. No respiratory distress. She has no wheezes.  Abdominal: She exhibits no distension.  Musculoskeletal: She exhibits no edema or tenderness.  Neurological: She is alert and oriented to person, place, and time. She exhibits normal muscle tone. Coordination normal.  Skin: Skin is warm and dry. No rash noted. She is not diaphoretic. No erythema. No pallor.  Psychiatric: She has a normal mood and affect. Her behavior is normal. Judgment and thought content normal.          Assessment & Plan:   Neurocysticercosis: IV lesions sp Neurosurgery by Dr. Adria DillMark Van Poppel in [place].   I would like to get records from [place] but am confident she DOES have Neurocysticercosis  We will give him Prednisone 80 mg for 2 days prior to starting therapy then start Albendazole 400mg  bid x 15 days and continue the steroids in the interim  I spent greater than 40 minutes with the patient including greater than 50% of time in face to face counsel of the patient and in coordination of their care.

## 2014-09-26 NOTE — Progress Notes (Signed)
Patient ID: Wendy HireFlorencia Haser, female   DOB: 08-26-1985, 29 y.o.   MRN: [id number] HPI: Wendy Delgado is a 29 y.o. female for evaluation of her neurocysticercosis.  Allergies: No Known Allergies  Vitals: Temp: 98 F (36.7 C) (02/16 1414) Temp Source: Oral (02/16 1414) BP: 118/81 mmHg (02/16 1414) Pulse Rate: 82 (02/16 1414)  Past Medical History: Past Medical History  Diagnosis Date  . Late prenatal care   . Headache   . Neurocysticercosis     Social History: History   Social History  . Marital Status: Married    Spouse Name: N/A  . Number of Children: N/A  . Years of Education: N/A   Social History Main Topics  . Smoking status: Never Smoker   . Smokeless tobacco: Never Used  . Alcohol Use: No  . Drug Use: No  . Sexual Activity: Yes    Birth Control/ Protection: None   Other Topics Concern  . None   Social History Narrative    Previous Regimen:   Current Regimen:  Labs: HEPATITIS B SURFACE AG (no units)  Date Value  09/01/2011 Negative    CrCl: CrCl cannot be calculated (Patient has no serum creatinine result on file.).  Lipids: No results found for: CHOL, TRIG, HDL, CHOLHDL, VLDL, LDLCALC  Assessment:  29 yo who is here to eval for her likely neurocysticercosis. She had her surgery in [place]. Dr Daiva EvesVan Dam is planning on treating her for this. We are going to use albendazole and prednisone and target for around 15 days. She doesn't have any medical coverage so we tried to get assistance for the meds from outpt pharmacy. Arrangement has been made and we are going to contact the pt tomorrow through an intepreter that the meds will be ready for pick up.   Recommendations:  Albendazole 400mg  PO BID Prednisone 80mg  PO qdwc Dc dexamethasone on profile  Clide CliffPham, Minh Quang, PharmD Clinical Infectious Disease Pharmacist [facility] 09/26/2014, 10:21 PM

## 2014-10-05 ENCOUNTER — Telehealth: Payer: Self-pay | Admitting: Licensed Clinical Social Worker

## 2014-10-05 NOTE — Telephone Encounter (Signed)
Patient's sister and law states that the patient is having sensitivity to light, wondering if it is a side effect from the medications she is taking. [facility], he states it is more than likely from the Albenza. Patient will need to complete course and it can't be changed to anything else due to this is the only treatment for her infection. Advised patient to wear sunglasses when she goes out.

## 2014-10-19 ENCOUNTER — Telehealth: Payer: Self-pay | Admitting: *Deleted

## 2014-10-19 NOTE — Telephone Encounter (Signed)
Sharing Dr. Daiva EvesVan Dam message.  Pt has one refill for the Prednisone rx at [facility].

## 2014-10-19 NOTE — Telephone Encounter (Signed)
Not clear to me. I am skeptical. Will cc our pharmacists

## 2014-10-19 NOTE — Telephone Encounter (Signed)
Severe Headache, started today.  Both medications were completed on Tuesday, March 8th.  Sister concerned and wondering what can be done to help her sister.  MD please advise.

## 2014-10-19 NOTE — Telephone Encounter (Signed)
Make sure she is taking all of her meds properly I already told pt I wojuld not rx meds fo her headache she has PCP for that

## 2014-10-20 NOTE — Telephone Encounter (Signed)
It is unclear. She was on a high dose of Prednisone (80mg /day) for 2 weeks. It usually 3 weeks or more to develop steroid dependence, but she may have developed it to a lesser degree. It is always hard to tell if this may be steroid withdrawal or maybe a worsening of her symptoms now that the steroid is out of her system?  For rheumatologic diseases we often try NSAIDs for 5-7 days to see if that can bridge the patient through the steroid withdrawal - would this be appropriate for her? Something like Naproxen BID with food for 5-7 days?

## 2014-10-25 ENCOUNTER — Ambulatory Visit (INDEPENDENT_AMBULATORY_CARE_PROVIDER_SITE_OTHER): Payer: Self-pay | Admitting: Infectious Disease

## 2014-10-25 ENCOUNTER — Encounter: Payer: Self-pay | Admitting: Infectious Disease

## 2014-10-25 VITALS — BP 117/78 | HR 71 | Temp 98.1°F | Wt 192.0 lb

## 2014-10-25 DIAGNOSIS — F19930 Other psychoactive substance use, unspecified with withdrawal, uncomplicated: Secondary | ICD-10-CM

## 2014-10-25 DIAGNOSIS — R519 Headache, unspecified: Secondary | ICD-10-CM

## 2014-10-25 DIAGNOSIS — G911 Obstructive hydrocephalus: Secondary | ICD-10-CM

## 2014-10-25 DIAGNOSIS — G919 Hydrocephalus, unspecified: Secondary | ICD-10-CM

## 2014-10-25 DIAGNOSIS — B690 Cysticercosis of central nervous system: Secondary | ICD-10-CM

## 2014-10-25 DIAGNOSIS — R51 Headache: Secondary | ICD-10-CM

## 2014-10-25 DIAGNOSIS — F19239 Other psychoactive substance dependence with withdrawal, unspecified: Secondary | ICD-10-CM | POA: Insufficient documentation

## 2014-10-25 DIAGNOSIS — F19230 Other psychoactive substance dependence with withdrawal, uncomplicated: Secondary | ICD-10-CM

## 2014-10-25 DIAGNOSIS — F19939 Other psychoactive substance use, unspecified with withdrawal, unspecified: Secondary | ICD-10-CM | POA: Insufficient documentation

## 2014-10-25 NOTE — Progress Notes (Signed)
Subjective:    Patient ID: Wendy Delgado, female    DOB: 12/15/1985, 29 y.o.   MRN: [id number]  HPI   29 year old [place] lady who was seen at [facility] with new seizures, obtundation. She was intubated and sent to [facility]. MRI showed  lesions in ventricle including the 3rd ventricle causing obstruction and hydrocephalus. She underwent placement of shunt here but did not have endoscopic removal. Serologies sent her were negative for  Neurocysticercosis though we felt confident this was the dx. 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 She was suffering from excruciating headaches and had her head in her hands in tears at beginning of last visit.   We rx her prednisone 80mg  daily prior to albendazole and then course of albendazole. When she came off of the albendazole she had recurrence of HA behind her eyes. She was told by pharmacist that this might be steroids withdrawal headache but despite my not giving ok to restart meds she went ahead and used the refill on the bottle and restarted at 80mg  daily and has been using since then and HA are gone. No fevers, chills or seizures.    Review of Systems  Constitutional: Negative for fever, chills, diaphoresis, activity change, appetite change, fatigue and unexpected weight change.  HENT: Negative for congestion, rhinorrhea, sinus pressure, sneezing, sore throat and trouble swallowing.   Eyes: Negative for photophobia and visual disturbance.  Respiratory: Negative for cough, chest tightness, shortness of breath, wheezing and stridor.   Cardiovascular: Negative for chest pain, palpitations and leg swelling.  Gastrointestinal: Negative for nausea, vomiting, abdominal pain, diarrhea, constipation, blood in stool, abdominal  distention and anal bleeding.  Genitourinary: Negative for dysuria, hematuria, flank pain and difficulty urinating.  Musculoskeletal: Negative for myalgias, back pain, joint swelling, arthralgias and gait problem.  Skin: Negative for color change, pallor, rash and wound.  Neurological: Negative for dizziness, tremors, weakness and light-headedness.  Hematological: Negative for adenopathy. Does not bruise/bleed easily.  Psychiatric/Behavioral: Negative for behavioral problems, confusion, sleep disturbance, decreased concentration and agitation.       Objective:   Physical Exam  Constitutional: She is oriented to person, place, and time. She appears well-developed and well-nourished. No distress.  HENT:  Head: Normocephalic and atraumatic.    Mouth/Throat: No oropharyngeal exudate.  Eyes: Conjunctivae and EOM are normal. No scleral icterus.  Neck: Normal range of motion. Neck supple.  Cardiovascular: Normal rate and regular rhythm.   Pulmonary/Chest: Effort normal. No respiratory distress. She has no wheezes.  Abdominal: She exhibits no distension.  Musculoskeletal: She exhibits no edema or tenderness.  Neurological: She is alert and oriented to person, place, and time. She exhibits normal muscle tone. Coordination normal.  Skin: Skin is warm and dry. No rash noted. She is not diaphoretic. No erythema. No pallor.  Psychiatric: She has a normal mood and affect. Her behavior is normal. Judgment and thought content normal.          Assessment & Plan:   Neurocysticercosis: IV lesions sp Neurosurgery by Dr. Adria DillMark Van Poppel in [place].   We will gave her Prednisone 80 mg for 2 days prior to starting therapy then start Albendazole 400mg  bid x 15 days and continue the steroids in the interim  She then had recurrence of ha and  restaarted her HA  I think she should be cured of her Neurocystercosis  Headaches: possibly could be steroid withdrawal headache. Ulyses [place] my pharmacist came  up with a steroid taper for her  I spent greater than 25 minutes with the patient including greater than 50% of time in face to face counsel of the patient and in coordination of their care. We used Engineer, structural to facilitate discussions re Neurocysticercosis and steroid withdrawal HA and risks benefits of steroids and reason for using them for [place]

## 2014-10-27 NOTE — Progress Notes (Signed)
Patient ID: Wendy Delgado, female   DOB: 01/13/1986, 29 y.o.   MRN: [id number] HPI: Wendy Delgado is a 29 y.o. female who is here for her f/u with neurocysticercosis.   Allergies: No Known Allergies  Vitals:    Past Medical History: Past Medical History  Diagnosis Date  . Late prenatal care   . Headache   . Neurocysticercosis     Social History: History   Social History  . Marital Status: Married    Spouse Name: N/A  . Number of Children: N/A  . Years of Education: N/A   Social History Main Topics  . Smoking status: Never Smoker   . Smokeless tobacco: Never Used  . Alcohol Use: No  . Drug Use: No  . Sexual Activity: Yes    Birth Control/ Protection: None   Other Topics Concern  . None   Social History Narrative    Previous Regimen:   Current Regimen:  Labs: HEPATITIS B SURFACE AG (no units)  Date Value  09/01/2011 Negative    CrCl: CrCl cannot be calculated (Patient has no serum creatinine result on file.).  Lipids: No results found for: CHOL, TRIG, HDL, CHOLHDL, VLDL, LDLCALC  Assessment: 29 yo who was recently seen for neurocysticercosis and was treated albendazole and prednisone. I believe the HA could due to her steroid. We are going to taper her steroids over 3 weeks since she was on 80mg  qday. Reduce to 40mg  qday x1 wk, then 20mg  qday x1 week, the 20mg  qod x 1 wk. This was relayed to her through an interpreter. We made her repeat the process back to us and she did.   Recommendations:  Prenisone taper over 3 wks  Clide CliffPham, Minh Quang, PharmD Clinical Infectious Disease Pharmacist [facility] 10/27/2014, 2:12 PM

## 2014-11-27 ENCOUNTER — Ambulatory Visit (INDEPENDENT_AMBULATORY_CARE_PROVIDER_SITE_OTHER): Payer: Medicaid Other | Admitting: Infectious Disease

## 2014-11-27 ENCOUNTER — Encounter: Payer: Self-pay | Admitting: Infectious Disease

## 2014-11-27 VITALS — Temp 98.2°F | Wt 206.0 lb

## 2014-11-27 DIAGNOSIS — F19230 Other psychoactive substance dependence with withdrawal, uncomplicated: Secondary | ICD-10-CM

## 2014-11-27 DIAGNOSIS — Z982 Presence of cerebrospinal fluid drainage device: Secondary | ICD-10-CM | POA: Insufficient documentation

## 2014-11-27 DIAGNOSIS — B690 Cysticercosis of central nervous system: Secondary | ICD-10-CM

## 2014-11-27 DIAGNOSIS — G919 Hydrocephalus, unspecified: Secondary | ICD-10-CM | POA: Diagnosis not present

## 2014-11-27 DIAGNOSIS — F19930 Other psychoactive substance use, unspecified with withdrawal, uncomplicated: Secondary | ICD-10-CM

## 2014-11-27 DIAGNOSIS — G911 Obstructive hydrocephalus: Secondary | ICD-10-CM | POA: Diagnosis not present

## 2014-11-27 NOTE — Patient Instructions (Signed)
Follow up with primary physician at [facility] to have evaluation of memory and cognition so she may be able to go back to work. Ultamately she should see a neurologist but unable to refer due to non insurance.

## 2014-11-27 NOTE — Progress Notes (Signed)
Subjective:    Patient ID: Wendy Delgado, female    DOB: 02-19-1986, 29 y.o.   MRN: [id number]  HPI  29 year old [place] lady who was seen at [facility] with new seizures, obtundation. She was intubated and sent to [facility]. MRI showed  lesions in ventricle including the 3rd ventricle causing obstruction and hydrocephalus. She underwent placement of shunt here but did not have endoscopic removal. Serologies sent her were negative for  Neurocysticercosis though we felt confident this was the dx. 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 She was suffering from excruciating headaches and had her head in her hands in tears at beginning of last visit.   We rx her prednisone  daily prior to albendazole and then course of albendazole. When she came off of the albendazole she had recurrence of HA behind her eyes. She was told by pharmacist that this might be steroids withdrawal headache but despite my not giving ok to restart meds she went ahead and used the refill on the bottle and restarted at  daily and has been using since then and HA are gone. No fevers, chills or seizures.    We put her on a steroid taper now she is off the steroids altogether and denying having headaches. She does not have much in the way of neurological symptoms although the husband says that she at times does forget things. They asked if I can regular allow her to return to work but I did not feel comfortable given that I'm not going to build give a full neurological assessment. She has been seen by Dr. Venetia Maxon it sounds like this shunt may have been turned off?  Review of Systems  Constitutional: Negative for chills, diaphoresis, activity change, appetite change, fatigue and unexpected weight change.    HENT: Negative for congestion, sneezing and trouble swallowing.   Eyes: Negative for visual disturbance.  Respiratory: Negative for chest tightness, shortness of breath, wheezing and stridor.   Cardiovascular: Negative for chest pain, palpitations and leg swelling.  Gastrointestinal: Negative for diarrhea, constipation, blood in stool, abdominal distention and anal bleeding.  Genitourinary: Negative for dysuria, hematuria, flank pain and difficulty urinating.  Musculoskeletal: Negative for myalgias, joint swelling, arthralgias and gait problem.  Skin: Negative for color change, pallor, rash and wound.  Neurological: Negative for tremors and light-headedness.  Hematological: Negative for adenopathy. Does not bruise/bleed easily.  Psychiatric/Behavioral: Negative for behavioral problems, confusion, sleep disturbance, decreased concentration and agitation.       Objective:   Physical Exam  Constitutional: She is oriented to person, place, and time. She appears well-developed and well-nourished. No distress.  HENT:  Head: Normocephalic and atraumatic.    Mouth/Throat: No oropharyngeal exudate.  Eyes: Conjunctivae and EOM are normal. No scleral icterus.  Neck: Normal range of motion. Neck supple.  Cardiovascular: Normal rate and regular rhythm.   Pulmonary/Chest: Effort normal. No respiratory distress. She has no wheezes.  Abdominal: She exhibits no distension.  Musculoskeletal: She exhibits no edema or tenderness.  Neurological: She is alert and oriented to person, place, and time. She exhibits normal muscle tone. Coordination normal.  Skin: Skin is warm and dry. No rash noted. She is not diaphoretic. No erythema. No pallor.  Psychiatric: She has a normal mood and affect. Her behavior is  normal. Judgment and thought content normal.          Assessment & Plan:   Neurocysticercosis: IV lesions sp Neurosurgery by Dr. Adria DillMark [place].   We gave her Prednisone 80 mg for  2 days prior to starting therapy then start Albendazole 400mg  bid x 15 days and continue the steroids in the interim  She then had recurrence of ha and restaarted her HA  Since doing the steroid taper no more HA  Headaches: resolved once prednisone was tapered  Forgetfulness: would be helpful for her to see a Neurologist and PCP, I will see her back in a month to see how she is doing.  Shunt present for obstructive hydrocephalus, : Per the husband the shunt has been turned off. Will ask from notes from Dr. Danielle DessElsner  I spent greater than 25 minutes with the patient including greater than 50% of time in face to face counsel of the patient and in coordination of their care. We used Engineer, structuralpanish translator to facilitate discussions re Neurocysticercosis and steroid withdrawal HA , Neurological symptoms need for primary care.

## 2014-12-17 ENCOUNTER — Emergency Department (HOSPITAL_COMMUNITY)
Admission: EM | Admit: 2014-12-17 | Discharge: 2014-12-17 | Disposition: A | Discharge status: Home / Self Care | Payer: Self-pay | Attending: Emergency Medicine | Admitting: Emergency Medicine

## 2014-12-17 ENCOUNTER — Emergency Department (HOSPITAL_COMMUNITY): Payer: Medicaid Other

## 2014-12-17 ENCOUNTER — Encounter (HOSPITAL_COMMUNITY): Payer: Self-pay | Admitting: Emergency Medicine

## 2014-12-17 DIAGNOSIS — R109 Unspecified abdominal pain: Secondary | ICD-10-CM

## 2014-12-17 DIAGNOSIS — Z3202 Encounter for pregnancy test, result negative: Secondary | ICD-10-CM | POA: Insufficient documentation

## 2014-12-17 DIAGNOSIS — Z8619 Personal history of other infectious and parasitic diseases: Secondary | ICD-10-CM | POA: Insufficient documentation

## 2014-12-17 LAB — CBC WITH DIFFERENTIAL/PLATELET
Basophils Absolute: 0 10*3/uL (ref 0.0–0.1)
Basophils Relative: 0 % (ref 0–1)
EOS ABS: 0.1 10*3/uL (ref 0.0–0.7)
Eosinophils Relative: 1 % (ref 0–5)
HCT: 42.3 % (ref 36.0–46.0)
Hemoglobin: 13.9 g/dL (ref 12.0–15.0)
LYMPHS ABS: 1.7 10*3/uL (ref 0.7–4.0)
Lymphocytes Relative: 16 % (ref 12–46)
MCH: 31.7 pg (ref 26.0–34.0)
MCHC: 32.9 g/dL (ref 30.0–36.0)
MCV: 96.6 fL (ref 78.0–100.0)
MONO ABS: 0.6 10*3/uL (ref 0.1–1.0)
MONOS PCT: 5 % (ref 3–12)
NEUTROS PCT: 78 % — AB (ref 43–77)
Neutro Abs: 8.1 10*3/uL — ABNORMAL HIGH (ref 1.7–7.7)
Platelets: 359 10*3/uL (ref 150–400)
RBC: 4.38 MIL/uL (ref 3.87–5.11)
RDW: 13.1 % (ref 11.5–15.5)
WBC: 10.5 10*3/uL (ref 4.0–10.5)

## 2014-12-17 LAB — COMPREHENSIVE METABOLIC PANEL
ALK PHOS: 132 U/L — AB (ref 38–126)
ALT: 28 U/L (ref 14–54)
AST: 22 U/L (ref 15–41)
Albumin: 3.6 g/dL (ref 3.5–5.0)
Anion gap: 8 (ref 5–15)
BILIRUBIN TOTAL: 0.6 mg/dL (ref 0.3–1.2)
CHLORIDE: 103 mmol/L (ref 101–111)
CO2: 27 mmol/L (ref 22–32)
Calcium: 9.1 mg/dL (ref 8.9–10.3)
Creatinine, Ser: 0.53 mg/dL (ref 0.44–1.00)
GFR calc Af Amer: >60 mL/min (ref >60)
GFR calc non Af Amer: >60 mL/min (ref >60)
GLUCOSE: 95 mg/dL (ref 70–99)
Potassium: 3.9 mmol/L (ref 3.5–5.1)
Sodium: 138 mmol/L (ref 135–145)
TOTAL PROTEIN: 7.1 g/dL (ref 6.5–8.1)

## 2014-12-17 LAB — URINALYSIS, ROUTINE W REFLEX MICROSCOPIC
BILIRUBIN URINE: NEGATIVE
GLUCOSE, UA: NEGATIVE mg/dL
HGB URINE DIPSTICK: NEGATIVE
Ketones, ur: NEGATIVE mg/dL
Leukocytes, UA: NEGATIVE
Nitrite: NEGATIVE
PROTEIN: NEGATIVE mg/dL
Specific Gravity, Urine: 1.01 (ref 1.005–1.030)
Urobilinogen, UA: 1 mg/dL (ref 0.0–1.0)
pH: 8 (ref 5.0–8.0)

## 2014-12-17 LAB — PREGNANCY, URINE: PREG TEST UR: NEGATIVE

## 2014-12-17 LAB — LIPASE, BLOOD: LIPASE: 18 U/L — AB (ref 22–51)

## 2014-12-17 MED ORDER — NAPROXEN 500 MG PO TABS: 500.0000 mg | ORAL_TABLET | Freq: Two times a day (BID) | ORAL | Status: DC

## 2014-12-17 MED ORDER — IOHEXOL 300 MG/ML  SOLN
100.0000 mL | Freq: Once | INTRAMUSCULAR | Status: AC | PRN
  Administered 2014-12-17: 100 mL via INTRAVENOUS

## 2014-12-17 MED ORDER — ONDANSETRON HCL 4 MG/2ML IJ SOLN: 4.0000 mg | Freq: Once | INTRAMUSCULAR | Status: DC

## 2014-12-17 MED ORDER — IOHEXOL 300 MG/ML  SOLN
25.0000 mL | Freq: Once | INTRAMUSCULAR | Status: AC | PRN
  Administered 2014-12-17: 25 mL via ORAL

## 2014-12-17 MED ORDER — SODIUM CHLORIDE 0.9 % IV SOLN: INTRAVENOUS | Status: DC

## 2014-12-17 MED ORDER — HYDROCODONE-ACETAMINOPHEN 5-325 MG PO TABS: 1.0000 | ORAL_TABLET | Freq: Four times a day (QID) | ORAL | Status: DC | PRN

## 2014-12-17 MED ORDER — SODIUM CHLORIDE 0.9 % IV BOLUS (SEPSIS)
500.0000 mL | Freq: Once | INTRAVENOUS | Status: AC
  Administered 2014-12-17: 500 mL via INTRAVENOUS

## 2014-12-17 MED ORDER — FENTANYL CITRATE (PF) 100 MCG/2ML IJ SOLN: 50.0000 ug | Freq: Once | INTRAMUSCULAR | Status: DC

## 2014-12-17 NOTE — ED Provider Notes (Addendum)
CSN: [id number]     Arrival date & time 12/17/14  1118 History   First MD Initiated Contact with Patient 12/17/14 1147     Chief Complaint  Patient presents with  . Abdominal Pain  . shunt problem      (Consider location/radiation/quality/duration/timing/severity/associated sxs/prior Treatment) Patient is a 29 y.o. female presenting with abdominal pain. The history is provided by the patient.  Abdominal Pain Associated symptoms: dysuria   Associated symptoms: no chest pain, no fever, no hematuria, no nausea, no shortness of breath and no vomiting    patient with language by her but one of the family member speaks excellent English and is able to interpret fine. Patient had the revision of the VP shunt done in December. Patient's been complaining of some abdominal pain after taking out the garbage 5 days ago this may have been consistent with lifting something heavy. Patient states though that in the last 24 hours the pain is got sniffily worse and is predominantly on the right side of the abdomen. Admitted to me having dysuria but no nausea no vomiting no fevers. No headache.  Past Medical History  Diagnosis Date  . Late prenatal care   . Headache   . Neurocysticercosis    Past Surgical History  Procedure Laterality Date  . Shunt revision ventricular-peritoneal N/A 08/08/2014    Procedure: SHUNT REVISION VENTRICULAR-PERITONEAL;  Surgeon: Barnett AbuHenry Elsner, MD;  Location: [facility];  Service: Neurosurgery;  Laterality: N/A;  . Ventriculoperitoneal shunt Right 08/08/2014    Procedure: SHUNT INSERTION VENTRICULAR-PERITONEAL;  Surgeon: Barnett AbuHenry Elsner, MD;  [facility];  Service: Neurosurgery;  Laterality: Right;  . Endoscopic surgery of third ventricle     Family History  Problem Relation Age of Onset  . Diabetes     History  Substance Use Topics  . Smoking status: Never Smoker   . Smokeless tobacco: Never Used  . Alcohol Use: No   OB History    Gravida Para Term Preterm AB TAB  SAB Ectopic Multiple Living   1 1 1  0 0 0 0 0 0 1     Review of Systems  Constitutional: Negative for fever.  HENT: Negative for congestion.   Eyes: Negative for visual disturbance.  Respiratory: Negative for shortness of breath.   Cardiovascular: Negative for chest pain.  Gastrointestinal: Positive for abdominal pain. Negative for nausea and vomiting.  Genitourinary: Positive for dysuria. Negative for hematuria.  Musculoskeletal: Negative for back pain.  Skin: Negative for rash.  Hematological: Does not bruise/bleed easily.  Psychiatric/Behavioral: Negative for confusion.      Allergies  Review of patient's allergies indicates no known allergies.  Home Medications   Prior to Admission medications   Medication Sig Start Date End Date Taking? Authorizing Provider  acetaminophen (TYLENOL) 500 MG tablet Take 500 mg by mouth every 6 (six) hours as needed for mild pain or moderate pain.   Yes Historical Provider, MD   BP 118/62 mmHg  Pulse 62  Temp(Src) 98.3 F (36.8 C) (Oral)  Resp 15  SpO2 100%  LMP  (LMP Unknown) Physical Exam  Constitutional: She is oriented to person, place, and time. She appears well-developed and well-nourished. No distress.  HENT:  Head: Normocephalic and atraumatic.  Mouth/Throat: Oropharynx is clear and moist.  Eyes: Conjunctivae and EOM are normal. Pupils are equal, round, and reactive to light.  Neck: Normal range of motion.  Cardiovascular: Normal rate and regular rhythm.   No murmur heard. Pulmonary/Chest: Effort normal and breath sounds normal.  No respiratory distress.  Abdominal: Soft. Bowel sounds are normal. There is tenderness. There is guarding.  Musculoskeletal: Normal range of motion.  Neurological: She is alert and oriented to person, place, and time. No cranial nerve deficit. She exhibits normal muscle tone. Coordination normal.  Skin: Skin is warm. No rash noted.  Nursing note and vitals reviewed.   ED Course  Procedures  (including critical care time) Labs Review Labs Reviewed  CBC WITH DIFFERENTIAL/PLATELET - Abnormal; Notable for the following:    Neutrophils Relative % 78 (*)    Neutro Abs 8.1 (*)    All other components within normal limits  COMPREHENSIVE METABOLIC PANEL - Abnormal; Notable for the following:    BUN <5 (*)    Alkaline Phosphatase 132 (*)    All other components within normal limits  LIPASE, BLOOD - Abnormal; Notable for the following:    Lipase 18 (*)    All other components within normal limits  URINALYSIS, ROUTINE W REFLEX MICROSCOPIC  PREGNANCY, URINE   Results for orders placed or performed during the hospital encounter of 12/17/14  CBC with Differential/Platelet  Result Value Ref Range   WBC 10.5 4.0 - 10.5 K/uL   RBC 4.38 3.87 - 5.11 MIL/uL   Hemoglobin 13.9 12.0 - 15.0 g/dL   HCT 16.1 09.6 - 04.5 %   MCV 96.6 78.0 - 100.0 fL   MCH 31.7 26.0 - 34.0 pg   MCHC 32.9 30.0 - 36.0 g/dL   RDW 40.9 81.1 - 91.4 %   Platelets 359 150 - 400 K/uL   Neutrophils Relative % 78 (H) 43 - 77 %   Neutro Abs 8.1 (H) 1.7 - 7.7 K/uL   Lymphocytes Relative 16 12 - 46 %   Lymphs Abs 1.7 0.7 - 4.0 K/uL   Monocytes Relative 5 3 - 12 %   Monocytes Absolute 0.6 0.1 - 1.0 K/uL   Eosinophils Relative 1 0 - 5 %   Eosinophils Absolute 0.1 0.0 - 0.7 K/uL   Basophils Relative 0 0 - 1 %   Basophils Absolute 0.0 0.0 - 0.1 K/uL  Comprehensive metabolic panel  Result Value Ref Range   Sodium 138 135 - 145 mmol/L   Potassium 3.9 3.5 - 5.1 mmol/L   Chloride 103 101 - 111 mmol/L   CO2 27 22 - 32 mmol/L   Glucose, Bld 95 70 - 99 mg/dL   BUN <5 (L) 6 - 20 mg/dL   Creatinine, Ser 7.82 0.44 - 1.00 mg/dL   Calcium 9.1 8.9 - 95.6 mg/dL   Total Protein 7.1 6.5 - 8.1 g/dL   Albumin 3.6 3.5 - 5.0 g/dL   AST 22 15 - 41 U/L   ALT 28 14 - 54 U/L   Alkaline Phosphatase 132 (H) 38 - 126 U/L   Total Bilirubin 0.6 0.3 - 1.2 mg/dL   GFR calc non Af Amer >60 >60 mL/min   GFR calc Af Amer >60 >60 mL/min    Anion gap 8 5 - 15  Lipase, blood  Result Value Ref Range   Lipase 18 (L) 22 - 51 U/L  Urinalysis, Routine w reflex microscopic  Result Value Ref Range   Color, Urine YELLOW YELLOW   APPearance CLEAR CLEAR   Specific Gravity, Urine 1.010 1.005 - 1.030   pH 8.0 5.0 - 8.0   Glucose, UA NEGATIVE NEGATIVE mg/dL   Hgb urine dipstick NEGATIVE NEGATIVE   Bilirubin Urine NEGATIVE NEGATIVE   Ketones, ur NEGATIVE NEGATIVE mg/dL  Protein, ur NEGATIVE NEGATIVE mg/dL   Urobilinogen, UA 1.0 0.0 - 1.0 mg/dL   Nitrite NEGATIVE NEGATIVE   Leukocytes, UA NEGATIVE NEGATIVE  Pregnancy, urine  Result Value Ref Range   Preg Test, Ur NEGATIVE NEGATIVE     Imaging Review Ct Abdomen Pelvis W Contrast  12/17/2014   CLINICAL DATA:  Abdominal pain and tenderness after feeling a pulling sensation when lifting a heavy bag 5 days ago. VP shunt placed in December 2015.  EXAM: CT ABDOMEN AND PELVIS WITH CONTRAST  TECHNIQUE: Multidetector CT imaging of the abdomen and pelvis was performed using the standard protocol following bolus administration of intravenous contrast.  CONTRAST:  100mL OMNIPAQUE IOHEXOL 300 MG/ML  SOLN  COMPARISON:  Abdomen radiographs dated 10/30/2014 and 08/02/2014.  FINDINGS: Minimal free peritoneal fluid adjacent to the liver and in the inferior pelvis. Small umbilical hernia containing fat. Anterior ventriculoperitoneal shunt catheter entering the abdomen to the right of midline and terminating in the anterior aspect of the mid to lower abdomen to the right of midline. There is mild soft tissue thickening surrounding and extending inferior to the inferior portion of the catheter on the right adjacent to the right colon and adjacent small bowel loops. There is mild diffuse wall thickening involving a loop of small bowel in that region.  Normal appearing liver, spleen, pancreas, gallbladder, adrenal glands, kidneys, ureters, urinary bladder, uterus and ovaries. The uterus is elongated, extending into  the lower abdomen just below the level of the umbilicus. The urinary bladder is mildly distended. Borderline enlargement of the appendix with a maximum diameter of 6.8 mm. No periappendiceal soft tissue stranding is seen. No enlarged lymph nodes. Minimal dependent atelectasis at the right lung base. Small calcified granuloma in the left lower lobe. Mild lumbar and lower thoracic spine degenerative changes.  IMPRESSION: 1. Small umbilical hernia containing fat. 2. Right anterior ventriculoperitoneal shunt catheter with mild adjacent soft tissue thickening and wall thickening involving an adjacent loop of small bowel. This may be due to chronic inflammation/scarring associated with the catheter. 3. Borderline enlarged appendix with no periappendiceal soft tissue stranding. This could be normal or due to early appendicitis. 4. Minimal free peritoneal fluid, possibly due to the VP shunt.   Electronically Signed   By: Beckie SaltsSteven  Reid M.D.   On: 12/17/2014 14:50     EKG Interpretation None      MDM   Final diagnoses:  Abdominal pain    Patient with significant tenderness right lower quadrant. Has had symptoms for about 5 days but definitely worse in the last 24 hours. Based on CT scan suspect that it may be due to the small bowel inflammation due to the shunt catheter however they are making, and set the appendix is borderline enlarged and that they cannot completely rule out early appendicitis. Patient does have marked tenderness there with some guarding. Will consult surgery for opinion.   Pretty test negative no signs of urinary tract infection despite history of dysuria.  Vanetta MuldersScott Yida Hyams, MD 12/17/14 1525  Discussed with on-call general surgery Dr. Dwain SarnaWakefield. They have no concerns for acute appendicitis at this point in time do not recommend admission for observation. Patient can be discharged home from their standpoint. They feel that the pain is related to the irritation the small bowel from the  shunt catheter.  Vanetta MuldersScott Liam Cammarata, MD 12/17/14 [phone]

## 2014-12-17 NOTE — Discharge Instructions (Signed)
Concerns discussed with general surgery patient cleared to go home. Feel that most of it is due to the irritation of the small bowel from the shunt catheter. Take the medications as directed. Make an appointment to follow-up with your neurosurgeon. Return for any new or worse symptoms at all any development of fevers persistent vomiting worse abdominal pain.

## 2014-12-17 NOTE — ED Notes (Signed)
Pt refusing any pain meds or nausea meds at this time.

## 2014-12-17 NOTE — ED Notes (Signed)
Has had shunt with drain placed in December-- was taking out garbage 5 days ago-- lifted heavy bag and felt something pull in abd. Pain has gotten progressively worse since. abd soft, tender to palpation. Last BM yesterday, denies urinary symptoms.

## 2014-12-27 ENCOUNTER — Ambulatory Visit: Payer: Medicaid Other | Admitting: Infectious Disease

## 2016-02-29 IMAGING — CR DG CHEST 1V PORT
1 series · 1 of 1 positions shown · non-contrast
Comparison: None.

CLINICAL DATA: Intubation, unresponsive

EXAM:
PORTABLE CHEST - 1 VIEW

[portable]
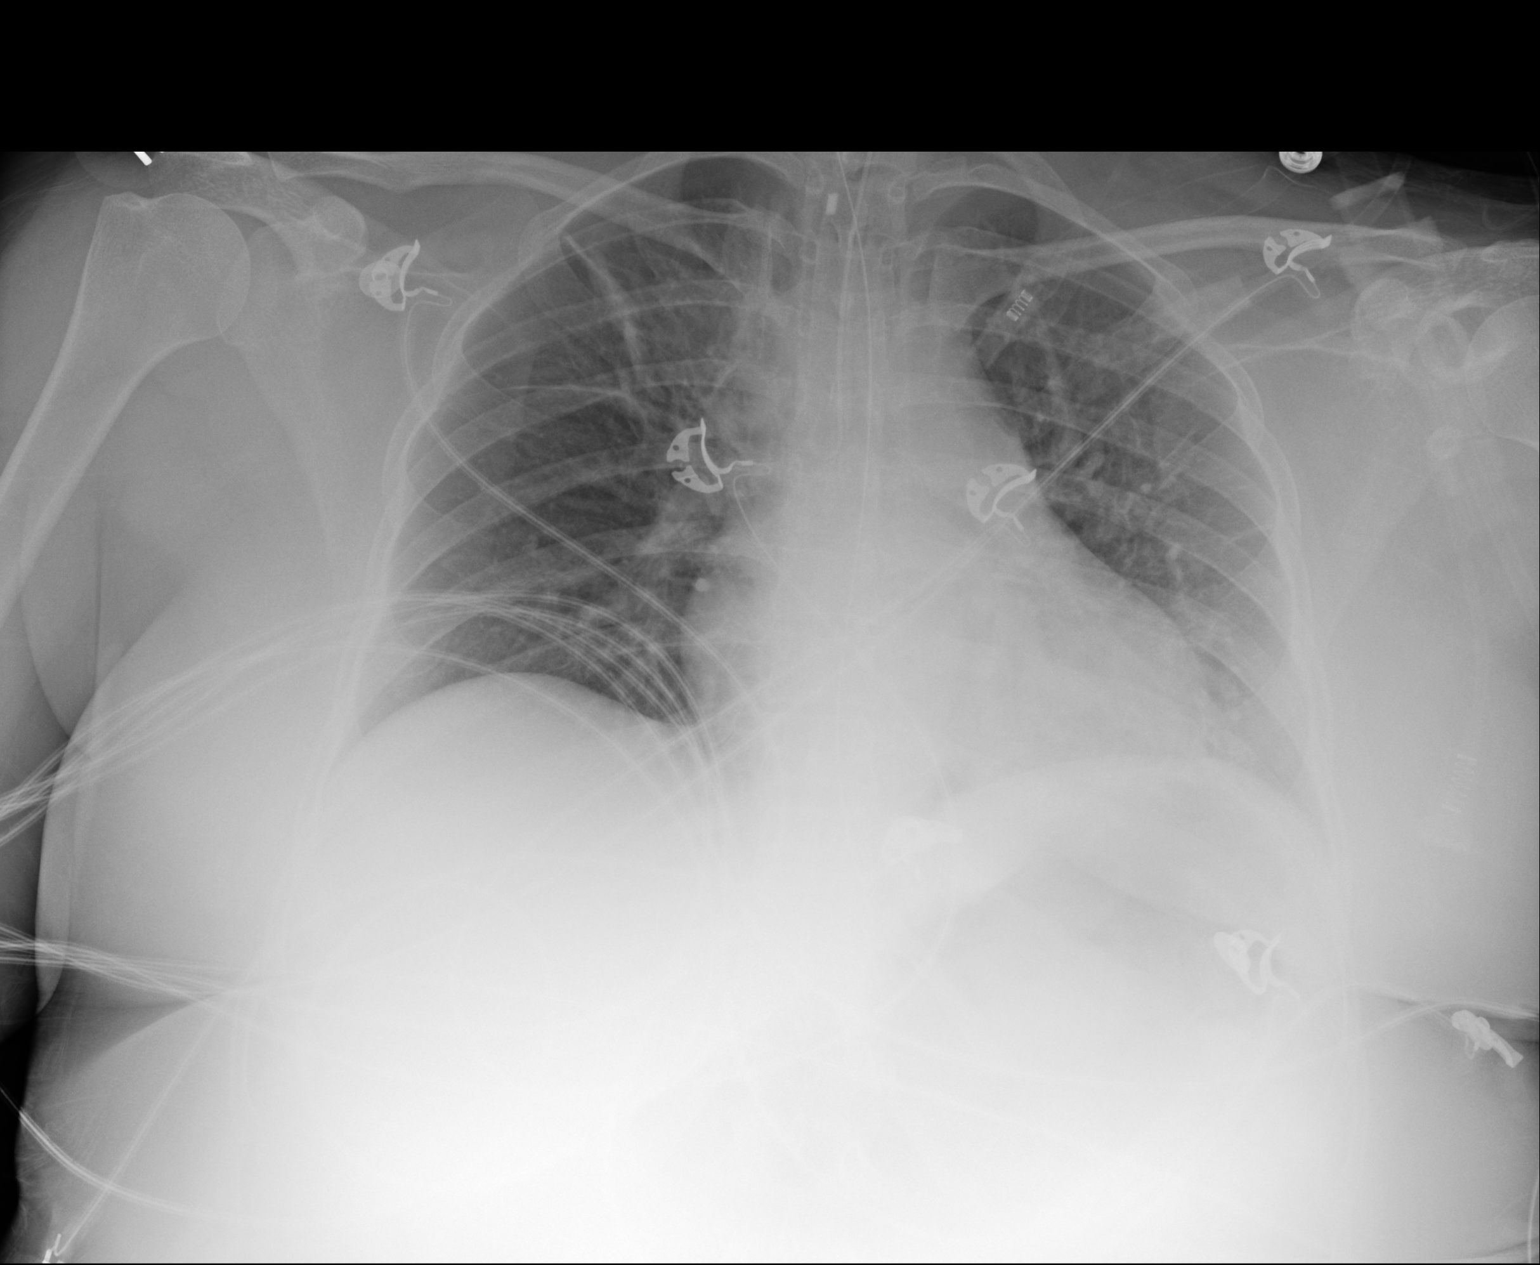

[1 of 1 positions shown; findings below may reference images not displayed]

FINDINGS: Endotracheal tube tip terminates 7 mm over the right mainstem
bronchus. Nasogastric tube tip terminates below the level of the
hemidiaphragms but is not included in the field of view. Lungs are
hypoaerated with crowding of the bronchovascular markings. Bilateral
linear atelectasis is present. Patchy retrocardiac opacity is
suboptimally visualized. No pleural effusion. Heart size is normal.
IMPRESSION: Patchy bilateral atelectasis and retrocardiac opacity which could
also represent atelectasis although early pneumonia could appear
similar

Right mainstem bronchus intubation. Recommend pulling back
endotracheal tube approximately 2.7 cm.

Critical Value/emergent results were called by telephone at the time
of interpretation on 08/01/2014 at [DATE] to Dr. GERALT Z MACHEJ ,
who verbally acknowledged these results.

## 2016-02-29 IMAGING — CT CT HEAD W/O CM
1 of 2 series · 16 of 30 positions shown, 20 images · non-contrast
Comparison: None.

CLINICAL DATA: Altered mental status

EXAM:
CT HEAD WITHOUT CONTRAST
TECHNIQUE: Contiguous axial images were obtained from the base of the skull
through the vertex without intravenous contrast.

[Series 3: headtrauma 2.4 h60s · axial · 0.43mm/px · z∈[+134,+289]mm · 16 of 72 slices shown, 20 images]
[im 4/72  brain]
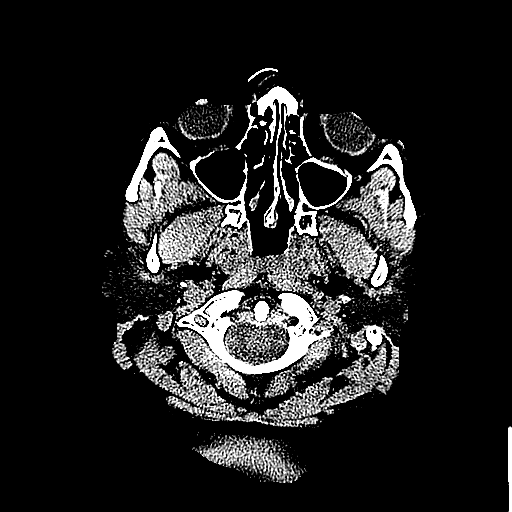
[im 4/72  bone]
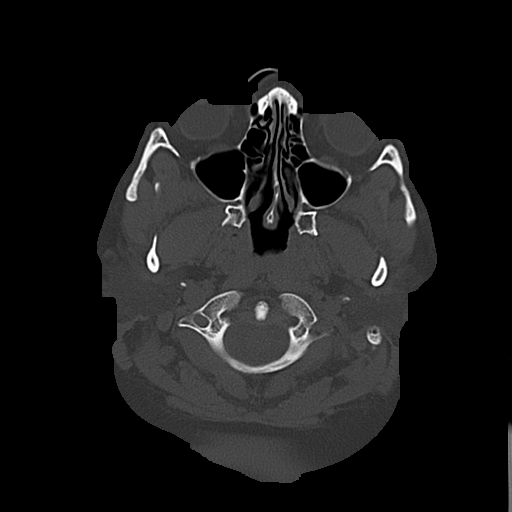
[im 8/72  brain]
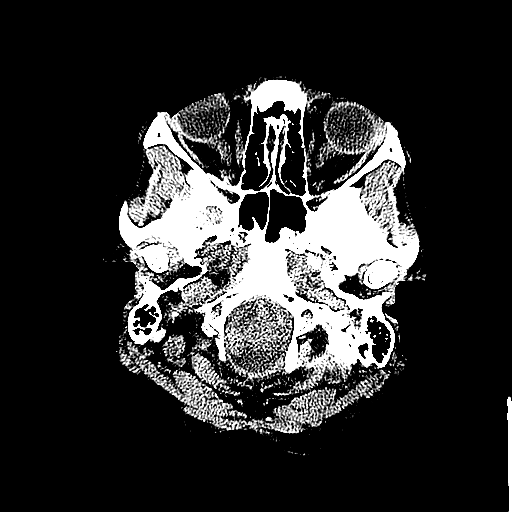
[im 12/72  brain]
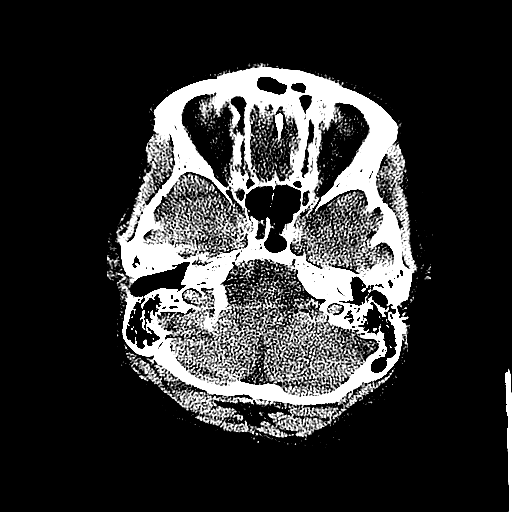
[im 15/72  brain]
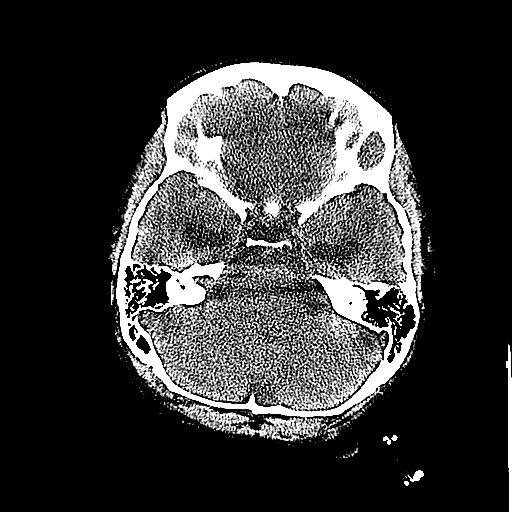
[im 23/72  brain]
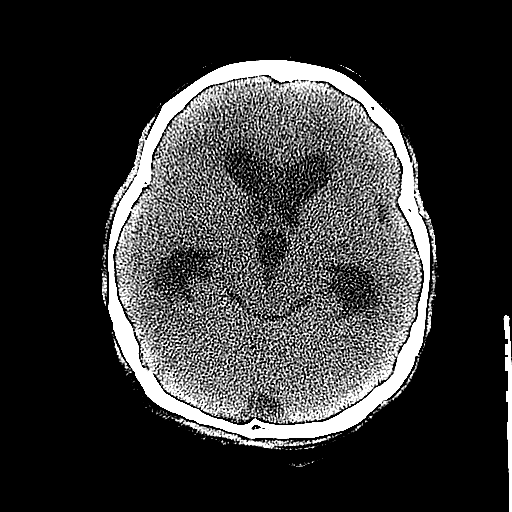
[im 23/72  bone]
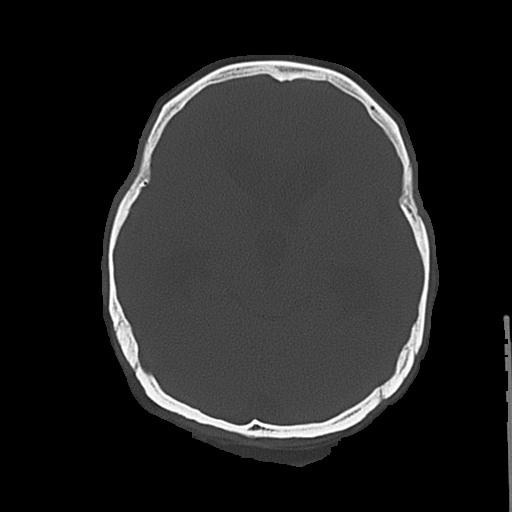
[im 27/72  brain]
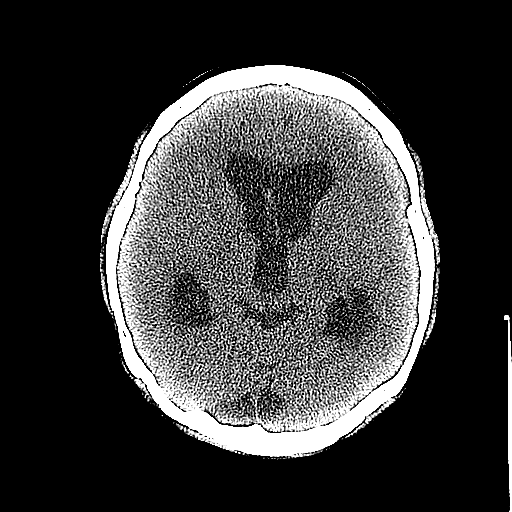
[im 30/72  brain]
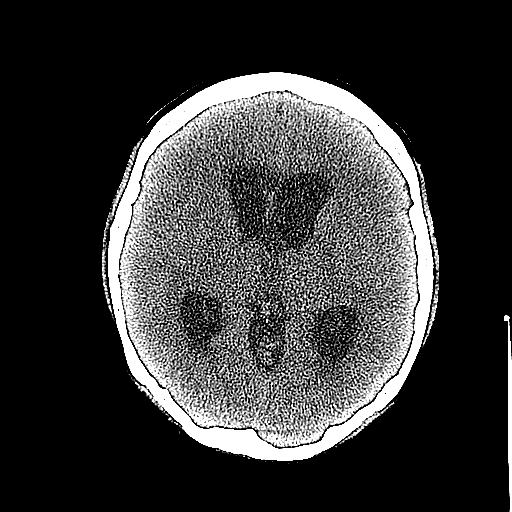
[im 34/72  brain]
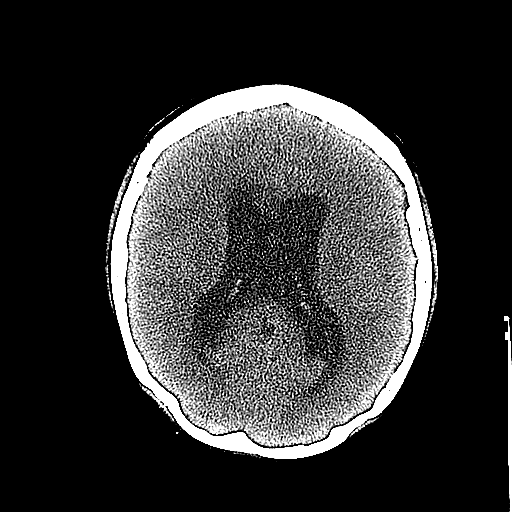
[im 38/72  brain]
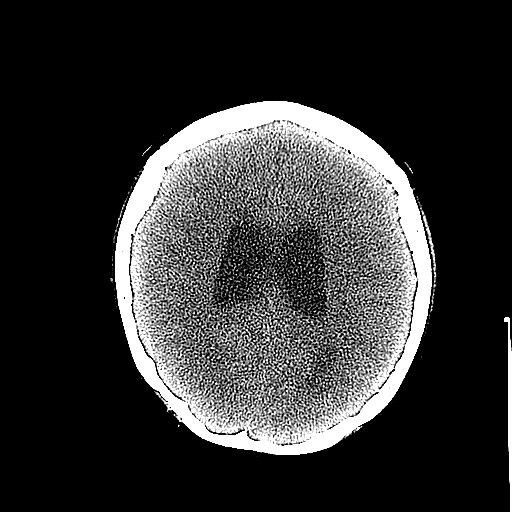
[im 38/72  bone]
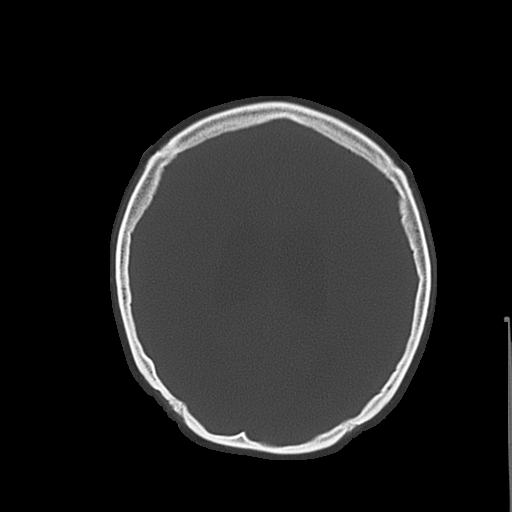
[im 42/72  brain]
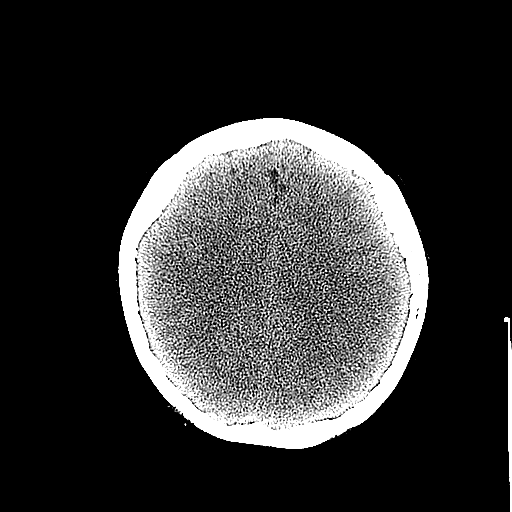
[im 45/72  brain]
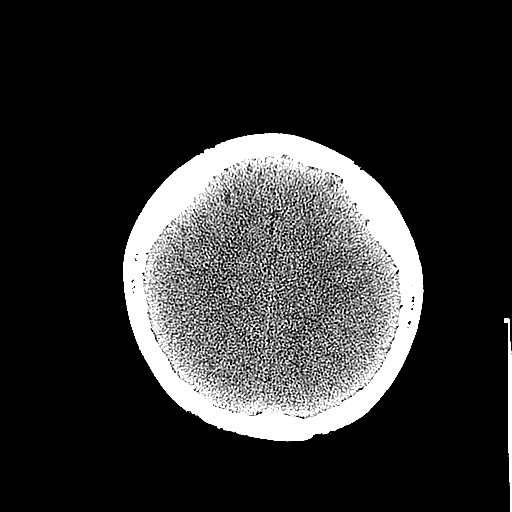
[im 49/72  brain]
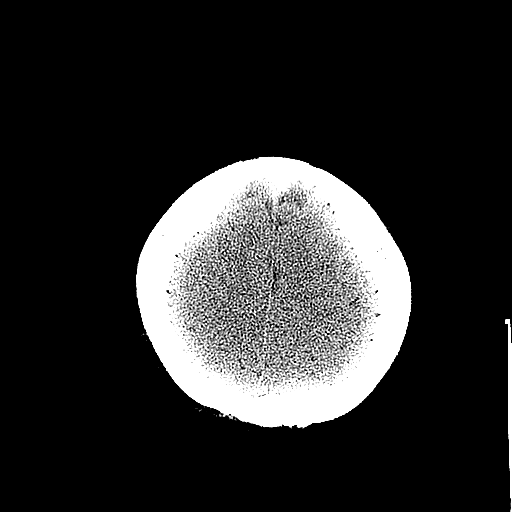
[im 57/72  brain]
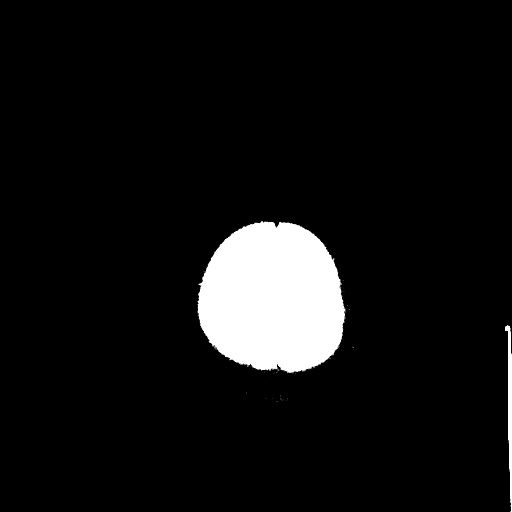
[im 57/72  bone]
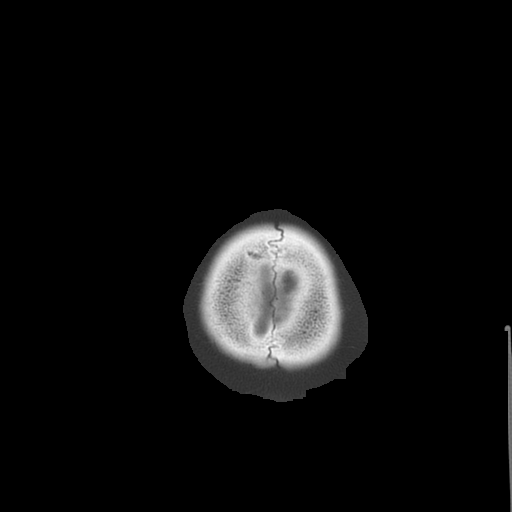
[im 60/72  brain]
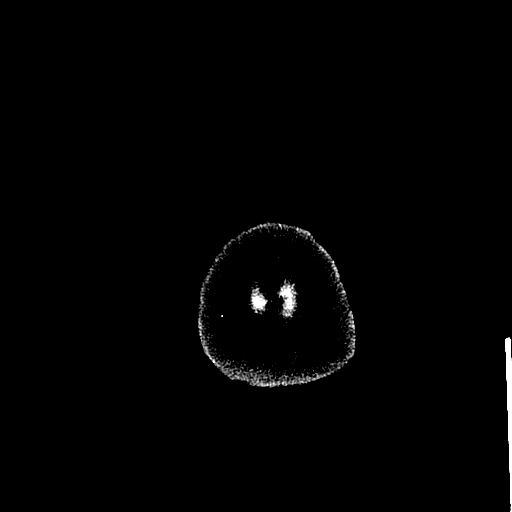
[im 64/72  brain]
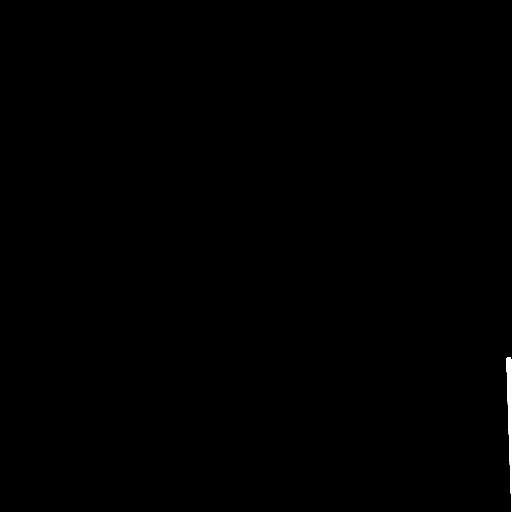
[im 68/72  brain]
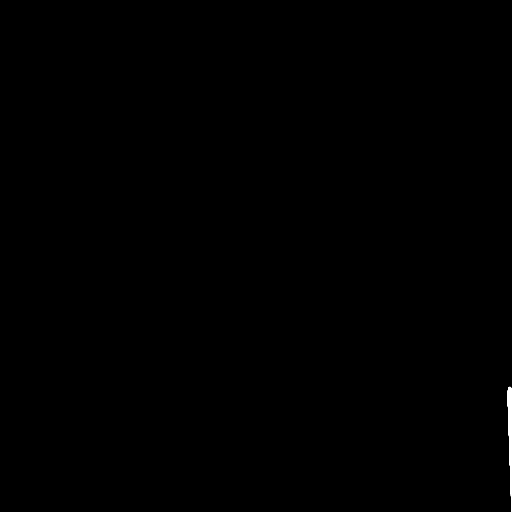

[16 of 30 positions shown; findings below may reference images not displayed]

FINDINGS: No intracranial hemorrhage or midline shift. No skull fracture is
noted. Paranasal sinuses and mastoid air cells are unremarkable.

There is moderate hydrocephalus. The gray and white-matter
differentiation is preserved. No definite acute cortical infarction.
No intra or extra-axial fluid collection.
IMPRESSION: No intracranial hemorrhage or midline shift. Moderate hydrocephalus.
The gray and white-matter differentiation is preserved. No definite
acute cortical infarction.

## 2016-03-03 IMAGING — CR DG CHEST 1V PORT
1 series · 1 of 1 positions shown · non-contrast
Comparison: 08/03/2014

CLINICAL DATA: Shortness of breath and cough

EXAM:
PORTABLE CHEST - 1 VIEW

[AP]
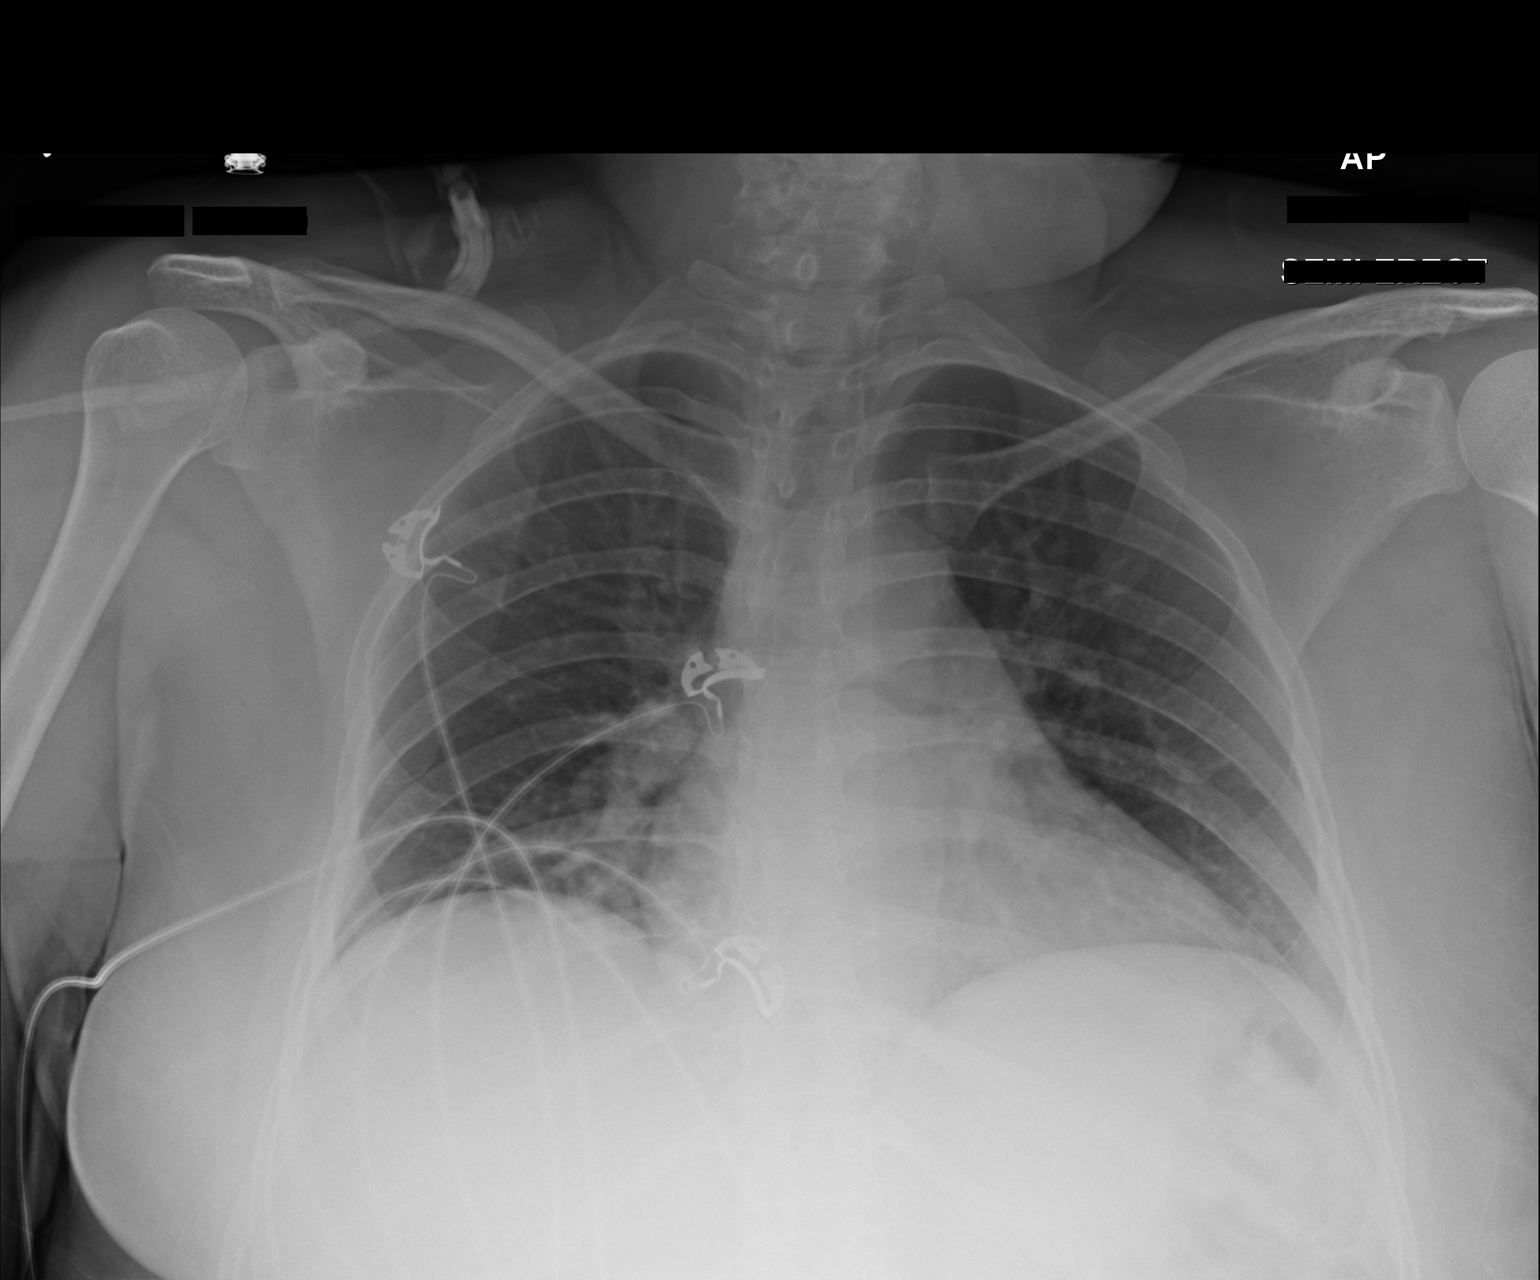

[1 of 1 positions shown; findings below may reference images not displayed]

FINDINGS: The cardiac shadow is stable. The endotracheal tube and nasogastric
catheter have been removed in the interval. The lungs are well
aerated bilaterally. No focal infiltrate is noted. Mild residual
right basilar atelectasis is seen.
IMPRESSION: Mild residual right basilar atelectasis. No new focal abnormality is
noted.

## 2018-03-25 ENCOUNTER — Other Ambulatory Visit: Payer: Self-pay

## 2018-03-25 ENCOUNTER — Encounter (HOSPITAL_COMMUNITY): Payer: Self-pay

## 2018-03-25 ENCOUNTER — Emergency Department (HOSPITAL_COMMUNITY)
Admission: EM | Admit: 2018-03-25 | Discharge: 2018-03-25 | Disposition: A | Discharge status: Home / Self Care | Payer: 59 | Attending: Emergency Medicine | Admitting: Emergency Medicine

## 2018-03-25 ENCOUNTER — Emergency Department (HOSPITAL_COMMUNITY): Payer: 59

## 2018-03-25 DIAGNOSIS — M545 Low back pain, unspecified: Secondary | ICD-10-CM

## 2018-03-25 DIAGNOSIS — Z79899 Other long term (current) drug therapy: Secondary | ICD-10-CM | POA: Insufficient documentation
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
## 2018-03-25 NOTE — Discharge Instructions (Addendum)
Please return for any problem.  Follow-up with your regular doctor as instructed. °

## 2019-11-30 ENCOUNTER — Ambulatory Visit
Admission: EM | Admit: 2019-11-30 | Discharge: 2019-11-30 | Disposition: A | Discharge status: Home / Self Care | Payer: 59

## 2019-11-30 NOTE — ED Notes (Signed)
Pt presented with sob, dizziness, and chest pain.  Vss.  Provider aware and instructed to go to women's or local er for eval.

## 2022-07-22 DIAGNOSIS — Z0000 Encounter for general adult medical examination without abnormal findings: Secondary | ICD-10-CM | POA: Diagnosis not present

## 2024-07-04 ENCOUNTER — Ambulatory Visit
Admission: EM | Admit: 2024-07-04 | Discharge: 2024-07-04 | Disposition: A | Discharge status: Home / Self Care | Payer: Self-pay | Attending: Family Medicine | Admitting: Family Medicine

## 2024-07-04 DIAGNOSIS — R10A1 Flank pain, right side: Secondary | ICD-10-CM

## 2024-07-04 LAB — POCT URINE DIPSTICK
Bilirubin, UA: NEGATIVE
Blood, UA: NEGATIVE
Glucose, UA: NEGATIVE mg/dL
Ketones, POC UA: NEGATIVE mg/dL
Leukocytes, UA: NEGATIVE
Nitrite, UA: NEGATIVE
POC PROTEIN,UA: NEGATIVE
Spec Grav, UA: >=1.03 — AB (ref 1.010–1.025)
Urobilinogen, UA: 0.2 U/dL
pH, UA: 5.5 (ref 5.0–8.0)

## 2024-07-04 MED ORDER — NAPROXEN 500 MG PO TABS
500.0000 mg | ORAL_TABLET | Freq: Two times a day (BID) | ORAL | 0 refills | Status: AC
Start: 2024-07-04 — End: ?

## 2024-07-04 MED ORDER — TIZANIDINE HCL 4 MG PO TABS: 4.0000 mg | ORAL_TABLET | Freq: Three times a day (TID) | ORAL | 0 refills | Status: AC | PRN

## 2024-07-04 NOTE — ED Triage Notes (Signed)
 Spanish Interpreter is being used in visit pt reports Lower abdominal pain x 2 weeks and now has back pain, hip pain, and buttocks. Denies any UTI symptoms

## 2024-07-04 NOTE — ED Provider Notes (Signed)
 [facility]    CSN: [id number] Arrival date & time: 07/04/24  1604      History   Chief Complaint No chief complaint on file.   HPI Wendy Delgado is a 38 y.o. female.   Medical interpreter utilized today to facilitate visit with patient's consent.  Patient is presenting today with about 2-week history of ongoing right low back pain radiating around to right lower abdominal area.  She states the pain is worse with movement, heavy lifting and does not seem to be resolved by anything though she has not tried any over-the-counter medications or remedies thus far.  Denies urinary symptoms, hematuria, nausea, vomiting, fevers, diarrhea or other bowel changes, new medications, recent injury.  No concern for STIs or pregnancy, LMP 05/24/2024 and has an implant in place.    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, not intractable, with status epilepticus (HCC)    Obstructive hydrocephalus (HCC)    Respiratory failure (HCC)    Hydrocephalus (HCC)    Neurocysticercosis    Status epilepticus (HCC) 08/02/2014   Acute encephalopathy 08/02/2014    Past Surgical History:  Procedure Laterality Date   ENDOSCOPIC SURGERY OF THIRD VENTRICLE     SHUNT REVISION VENTRICULAR-PERITONEAL N/A 08/08/2014   Procedure: SHUNT REVISION VENTRICULAR-PERITONEAL;  Surgeon: Victory Gens, MD;  Location: [facility];  Service: Neurosurgery;  Laterality: N/A;   VENTRICULOPERITONEAL SHUNT Right 08/08/2014   Procedure: SHUNT INSERTION VENTRICULAR-PERITONEAL;  Surgeon: Victory Gens, MD;  Location: [facility] OR;  Service: Neurosurgery;  Laterality: Right;    OB History     Gravida  2   Para  1   Term  1   Preterm   0   AB  0   Living  1      SAB  0   IAB  0   Ectopic  0   Multiple  0   Live Births  1            Home Medications    Prior to Admission medications   Medication Sig Start Date End Date Taking? Authorizing Provider  naproxen  (NAPROSYN ) 500 MG tablet Take 1 tablet (500 mg total) by mouth 2 (two) times daily. 07/04/24  Yes Stuart Vernell Norris, PA-C  tiZANidine  (ZANAFLEX ) 4 MG tablet Take 1 tablet (4 mg total) by mouth every 8 (eight) hours as needed for muscle spasms. Do not drink alcohol or drive while taking this medication.  May cause drowsiness. 07/04/24  Yes Stuart Vernell Norris, PA-C    Family History Family History  Problem Relation Age of Onset   Diabetes Other     Social History Social History   Tobacco Use   Smoking status: Never   Smokeless tobacco: Never  Substance Use Topics   Alcohol use: No   Drug use: No     Allergies   Patient has no known allergies.   Review of Systems Review of Systems Per HPI  Physical Exam Triage Vital Signs ED Triage Vitals  Encounter Vitals Group     BP 07/04/24 1639 130/73     Girls Systolic BP Percentile --      Girls Diastolic BP Percentile --  Boys Systolic BP Percentile --      Boys Diastolic BP Percentile --      Pulse Rate 07/04/24 1639 (!) 52     Resp 07/04/24 1639 20     Temp 07/04/24 1639 97.9 F (36.6 C)     Temp Source 07/04/24 1639 Oral     SpO2 07/04/24 1639 98 %     Weight --      Height --      Head Circumference --      Peak Flow --      Pain Score 07/04/24 1643 9     Pain Loc --      Pain Education --      Exclude from Growth Chart --    No data found.  Updated Vital Signs BP 130/73 (BP Location: Right Arm)   Pulse (!) 52   Temp 97.9 F (36.6 C) (Oral)   Resp 20   LMP 05/24/2024 (Exact Date)   SpO2 98%   Breastfeeding No   Visual Acuity Right Eye Distance:   Left Eye Distance:   Bilateral Distance:    Right Eye Near:   Left Eye Near:    Bilateral  Near:     Physical Exam Vitals and nursing note reviewed.  Constitutional:      Appearance: Normal appearance. She is not ill-appearing.  HENT:     Head: Atraumatic.  Eyes:     Extraocular Movements: Extraocular movements intact.     Conjunctiva/sclera: Conjunctivae normal.  Cardiovascular:     Rate and Rhythm: Normal rate.  Pulmonary:     Effort: Pulmonary effort is normal.  Abdominal:     General: Bowel sounds are normal. There is no distension.     Palpations: Abdomen is soft.     Tenderness: There is abdominal tenderness. There is no right CVA tenderness, left CVA tenderness or guarding.     Comments: Very minimal tenderness to palpation to the right lower quadrant region, negative McBurney's, negative Rovsing's  Musculoskeletal:        General: Tenderness present. No swelling or signs of injury. Normal range of motion.     Cervical back: Normal range of motion and neck supple.     Comments: Minimal right lumbar musculature tenderness to palpation.  No midline spinal tenderness to palpation diffusely.  Normal gait and range of motion, negative straight leg raise bilateral lower extremities  Skin:    General: Skin is warm and dry.  Neurological:     Mental Status: She is alert and oriented to person, place, and time.  Psychiatric:        Mood and Affect: Mood normal.        Thought Content: Thought content normal.        Judgment: Judgment normal.      UC Treatments / Results  Labs (all labs ordered are listed, but only abnormal results are displayed) Labs Reviewed  POCT URINE DIPSTICK - Abnormal; Notable for the following components:      Result Value   Spec Grav, UA >=1.030 (*)    All other components within normal limits    EKG   Radiology No results found.  Procedures Procedures (including critical care time)  Medications Ordered in UC Medications - No data to display  Initial Impression / Assessment and Plan / UC Course  I have reviewed the triage  vital signs and the nursing notes.  Pertinent labs & imaging results that were available during my care of the  patient were reviewed by me and considered in my medical decision making (see chart for details).     Urinalysis today without abnormality, suspect muscular etiology of pain.  No red flag findings on exam.  Treat with naproxen , Zanaflex , heat,/, stretches, rest.  Return for worsening or unresolving symptoms.  Final Clinical Impressions(s) / UC Diagnoses   Final diagnoses:  Right flank pain     Discharge Instructions      I suspect your pain is muscular so I have prescribed an anti-inflammatory pain medication and a muscle relaxer.  I recommend using heat, massage, gentle stretches, and avoid exertional activity until improving.  Follow-up for worsening or unresolving symptoms.    ED Prescriptions     Medication Sig Dispense Auth. Provider   naproxen  (NAPROSYN ) 500 MG tablet Take 1 tablet (500 mg total) by mouth 2 (two) times daily. 30 tablet Stuart Vernell Norris, PA-C   tiZANidine  (ZANAFLEX ) 4 MG tablet Take 1 tablet (4 mg total) by mouth every 8 (eight) hours as needed for muscle spasms. Do not drink alcohol or drive while taking this medication.  May cause drowsiness. 15 tablet Stuart Vernell Norris, [place]      PDMP not reviewed this encounter.   Stuart Vernell Norris, PA-C 07/04/24 1719

## 2024-07-04 NOTE — Discharge Instructions (Signed)
 I suspect your pain is muscular so I have prescribed an anti-inflammatory pain medication and a muscle relaxer.  I recommend using heat, massage, gentle stretches, and avoid exertional activity until improving.  Follow-up for worsening or unresolving symptoms.

## 2024-07-29 VITALS — BP 118/61 | HR 84 | Temp 98.1°F | Ht 61.0 in | Wt 235.5 lb

## 2024-07-29 DIAGNOSIS — Z13220 Encounter for screening for lipoid disorders: Secondary | ICD-10-CM | POA: Diagnosis not present

## 2024-07-29 DIAGNOSIS — Z23 Encounter for immunization: Secondary | ICD-10-CM

## 2024-07-29 DIAGNOSIS — Z0001 Encounter for general adult medical examination with abnormal findings: Secondary | ICD-10-CM | POA: Diagnosis not present

## 2024-07-29 DIAGNOSIS — R1031 Right lower quadrant pain: Secondary | ICD-10-CM

## 2024-07-29 DIAGNOSIS — Z7689 Persons encountering health services in other specified circumstances: Secondary | ICD-10-CM

## 2024-07-29 DIAGNOSIS — Z113 Encounter for screening for infections with a predominantly sexual mode of transmission: Secondary | ICD-10-CM

## 2024-07-29 DIAGNOSIS — R5383 Other fatigue: Secondary | ICD-10-CM

## 2024-07-29 DIAGNOSIS — Z0000 Encounter for general adult medical examination without abnormal findings: Secondary | ICD-10-CM

## 2024-07-29 NOTE — Progress Notes (Signed)
 "  New Patient Office Visit  Subjective    Patient ID: Wendy Delgado, female    DOB: [birth year]  Age: 38 y.o. MRN: [id number]  HPI Wendy Delgado presents to establish care and for annual physical.   The patient comes in today for a wellness visit.  A review of their health history was completed. A review of medications was also completed.  Any needed refills; No  Eating habits: Good  Falls/  MVA accidents in past few months: No  Regular exercise: Does not exercise regularly.   Sleep: Good  Menstrual cycles/sexual history: Has nexplanon, which was placed by gynecology on 06/04/2023. Has not had cycle in a year. Sexually active with 1 female partner.   Specialist pt sees on regular basis: None  Regular eye/dental exams: Yes  Preventative health issues were discussed.   Additional concerns: Has had right lower abdominal pain for a couple of weeks now. Saw urgent care on 07/04/2024.  Denies any specific fall or injury to the area. Pain is not aggravating by any specific foods. Denies fever, nausea, vomiting, diarrhea or constipation. Says pain just occurs with walking or lifting objects. Has tried taking naproxen  that they gave her at urgent care but says it is not working.   Past Medical History:  Diagnosis Date   Headache    Late prenatal care    Neurocysticercosis     Past Surgical History:  Procedure Laterality Date   ENDOSCOPIC SURGERY OF THIRD VENTRICLE     SHUNT REVISION VENTRICULAR-PERITONEAL N/A 08/08/2014   Procedure: SHUNT REVISION VENTRICULAR-PERITONEAL;  Surgeon: Victory Gens, MD;  Location: [facility];  Service: Neurosurgery;  Laterality: N/A;   VENTRICULOPERITONEAL SHUNT Right 08/08/2014   Procedure: SHUNT INSERTION VENTRICULAR-PERITONEAL;  Surgeon: Victory Gens, MD;  Location: [facility] OR;  Service: Neurosurgery;  Laterality: Right;    Family History  Problem Relation Age of Onset   Diabetes Other     Social History   Socioeconomic History   Marital  status: Married    Spouse name: Not on file   Number of children: Not on file   Years of education: Not on file   Highest education level: Not on file  Occupational History   Not on file  Tobacco Use   Smoking status: Never   Smokeless tobacco: Never  Substance and Sexual Activity   Alcohol use: No   Drug use: No   Sexual activity: Yes    Birth control/protection: None  Other Topics Concern   Not on file  Social History Narrative   Not on file   Social Drivers of Health   Tobacco Use: Low Risk (07/29/2024)   Patient History    Smoking Tobacco Use: Never    Smokeless Tobacco Use: Never    Passive Exposure: Not on file  Financial Resource Strain: Not on file  Food Insecurity: Not on file  Transportation Needs: Not on file  Physical Activity: Not on file  Stress: Not on file  Social Connections: Not on file  Intimate Partner Violence: Not on file  Depression (EYV7-0): Low Risk (07/29/2024)   Depression (PHQ2-9)    PHQ-2 Score: 1  Alcohol Screen: Not on file  Housing: Not on file  Utilities: Not on file  Health Literacy: Not on file    Review of Systems  Constitutional:  Positive for fatigue. Negative for fever.  HENT:  Negative for sore throat and trouble swallowing.   Eyes:  Negative for visual disturbance.  Respiratory:  Negative for cough, chest tightness,  shortness of breath and wheezing.   Cardiovascular:  Negative for chest pain and palpitations.  Gastrointestinal:  Positive for abdominal pain. Negative for blood in stool, constipation, diarrhea, nausea and vomiting.  Genitourinary:  Negative for difficulty urinating, dysuria, flank pain, menstrual problem, pelvic pain, urgency and vaginal discharge.  Neurological:  Negative for headaches.  Psychiatric/Behavioral:  Negative for sleep disturbance. The patient is not nervous/anxious.     Objective    BP 118/61 (BP Location: Left Arm, Patient Position: Sitting)   Pulse 84   Temp 98.1 F (36.7 C)   Ht 5' 1  (1.549 m)   Wt 235 lb 8 oz (106.8 kg)   LMP 05/24/2024 (Exact Date)   SpO2 98%   BMI 44.50 kg/m   Physical Exam Vitals and nursing note reviewed.  Constitutional:      General: She is not in acute distress.    Appearance: Normal appearance. She is not ill-appearing.  Neck:     Thyroid: No thyroid mass, thyromegaly or thyroid tenderness.  Cardiovascular:     Rate and Rhythm: Normal rate and regular rhythm.     Heart sounds: Normal heart sounds, S1 normal and S2 normal. No murmur heard. Pulmonary:     Effort: Pulmonary effort is normal. No respiratory distress.     Breath sounds: Normal breath sounds. No wheezing.  Abdominal:     General: There is no distension.     Palpations: Abdomen is soft.     Tenderness: There is abdominal tenderness in the right lower quadrant. There is no right CVA tenderness, left CVA tenderness, guarding or rebound. Negative signs include Murphy's sign, Rovsing's sign, McBurney's sign, psoas sign and obturator sign.     Hernia: No hernia is present.     Comments: No obvious masses or abnormalities noted on exam. Patient having dull right lower abdominal pain with palpation. No pain with release of pressure.   Lymphadenopathy:     Cervical: No cervical adenopathy.  Skin:    General: Skin is warm and dry.  Neurological:     Mental Status: She is alert. Mental status is at baseline.  Psychiatric:        Mood and Affect: Mood normal.        Behavior: Behavior normal.        Thought Content: Thought content normal.        Judgment: Judgment normal.       07/29/2024    8:10 AM 10/25/2014    3:36 PM 09/26/2014    2:17 PM  Depression screen PHQ 2/9  Decreased Interest 0 0 0  Down, Depressed, Hopeless 0 0 0  PHQ - 2 Score 0 0 0  Altered sleeping 0    Tired, decreased energy 1    Change in appetite 0    Feeling bad or failure about yourself  0    Trouble concentrating 0    Moving slowly or fidgety/restless 0    Suicidal thoughts 0    PHQ-9 Score 1     Difficult doing work/chores Not difficult at all         07/29/2024    8:10 AM  GAD 7 : Generalized Anxiety Score  Nervous, Anxious, on Edge 0  Control/stop worrying 0  Worry too much - different things 0  Trouble relaxing 0  Restless 0  Easily annoyed or irritable 0  Afraid - awful might happen 0  Total GAD 7 Score 0  Anxiety Difficulty Not difficult at all  Assessment & Plan:  1. Encounter to establish care -Advised patient to obtain vaccination records.  2. Well woman exam without gynecological exam (Primary) Adult wellness-complete.wellness physical was conducted today. Importance of diet and exercise were discussed in detail.  Importance of stress reduction and healthy living were discussed.  In addition to this a discussion regarding safety was also covered.  We also reviewed over immunizations and gave recommendations regarding current immunization needed for age.   In addition to this additional areas were also touched on including: Preventative health exams needed: None. Up to date on all preventative exams.   Patient was advised yearly wellness exam. -Would like to get baseline labs to assess liver function, kidney function, glucose, and lipids. Patient agrees.  - CMP14+EGFR  3. Screening for lipid disorders - Lipid panel  4. Screening for STDs (sexually transmitted diseases) - Hepatitis C antibody  5. Fatigue, unspecified type - CBC with Differential  6. Right lower quadrant pain Due to negative diagnostic testing for appendicitis and the absence of clinical symptoms, the presentation cannot be attributed to appendicitis. Will obtain CBC to assess for any signs of infection. Discussed with patient that this seems to be musculoskeletal related.  -Advised light stretches, tylenol  or ibuprofen  for pain control.  -Gave warning signs of appendicitis, and advised patient that if she experiences any nausea, vomiting, or fever to seek immediate care. Patient agrees  with plan.   7. Immunization due - Flu vaccine trivalent PF, 6mos and older(Flulaval,Afluria,Fluarix,Fluzone)   Return in about 1 year (around 07/29/2025) for physical.   Damien KATHEE Pringle, FNP  "

## 2024-07-30 LAB — CBC WITH DIFFERENTIAL/PLATELET
Basophils Absolute: 0.1 x10E3/uL (ref 0.0–0.2)
Basos: 1 %
EOS (ABSOLUTE): 0.3 x10E3/uL (ref 0.0–0.4)
Eos: 3 %
Hematocrit: 44.3 % (ref 34.0–46.6)
Hemoglobin: 14.8 g/dL (ref 11.1–15.9)
Immature Grans (Abs): 0 x10E3/uL (ref 0.0–0.1)
Immature Granulocytes: 0 %
Lymphocytes Absolute: 2.3 x10E3/uL (ref 0.7–3.1)
Lymphs: 29 %
MCH: 31.8 pg (ref 26.6–33.0)
MCHC: 33.4 g/dL (ref 31.5–35.7)
MCV: 95 fL (ref 79–97)
Monocytes Absolute: 0.5 x10E3/uL (ref 0.1–0.9)
Monocytes: 6 %
Neutrophils Absolute: 4.9 x10E3/uL (ref 1.4–7.0)
Neutrophils: 61 %
Platelets: 262 x10E3/uL (ref 150–450)
RBC: 4.66 x10E6/uL (ref 3.77–5.28)
RDW: 12.4 % (ref 11.7–15.4)
WBC: 8 x10E3/uL (ref 3.4–10.8)

## 2024-07-30 LAB — CMP14+EGFR
ALT: 34 IU/L — ABNORMAL HIGH (ref 0–32)
AST: 26 IU/L (ref 0–40)
Albumin: 4.2 g/dL (ref 3.9–4.9)
Alkaline Phosphatase: 129 IU/L — ABNORMAL HIGH (ref 41–116)
BUN/Creatinine Ratio: 16 (ref 9–23)
BUN: 9 mg/dL (ref 6–20)
Bilirubin Total: 0.3 mg/dL (ref 0.0–1.2)
CO2: 23 mmol/L (ref 20–29)
Calcium: 9.3 mg/dL (ref 8.7–10.2)
Chloride: 103 mmol/L (ref 96–106)
Creatinine, Ser: 0.55 mg/dL — ABNORMAL LOW (ref 0.57–1.00)
Globulin, Total: 2.6 g/dL (ref 1.5–4.5)
Glucose: 107 mg/dL — ABNORMAL HIGH (ref 70–99)
Potassium: 4.6 mmol/L (ref 3.5–5.2)
Sodium: 138 mmol/L (ref 134–144)
Total Protein: 6.8 g/dL (ref 6.0–8.5)
eGFR: 121 mL/min/1.73

## 2024-07-30 LAB — LIPID PANEL
Chol/HDL Ratio: 3.3 ratio (ref 0.0–4.4)
Cholesterol, Total: 156 mg/dL (ref 100–199)
HDL: 48 mg/dL
LDL Chol Calc (NIH): 88 mg/dL (ref 0–99)
Triglycerides: 111 mg/dL (ref 0–149)
VLDL Cholesterol Cal: 20 mg/dL (ref 5–40)

## 2024-07-30 LAB — HEPATITIS C ANTIBODY: Hep C Virus Ab: NONREACTIVE

## 2024-08-01 ENCOUNTER — Ambulatory Visit: Payer: Self-pay

## 2024-08-01 NOTE — Progress Notes (Signed)
 Please call the patient and let her know about her lab results. Patient is spanish speaking. Cholesterol levels were normal. No signs of infection, anemia, or bleeding disorders. Glucose was mildly elevated. Would like for her to work on diet and minimize food high in carbs and sugar. Would like for her to start exercising 150 minutes per week. Alkaline phosphatase and liver enzymes were mildly elevated, but not significant. Would like to trend these over time. Creatinine was mildly decreased. Will trend this over time as well. Will repeat these labs in 1 year at physical. Let me know if she has any questions.
